# Patient Record
Sex: Female | Born: 1935 | Race: White | Hispanic: No
Health system: Southern US, Community
[De-identification: ages and names within clinical notes are randomized; demographics above are authoritative.]

## PROBLEM LIST (undated history)

## (undated) DIAGNOSIS — I350 Nonrheumatic aortic (valve) stenosis: Secondary | ICD-10-CM

## (undated) DIAGNOSIS — M1990 Unspecified osteoarthritis, unspecified site: Secondary | ICD-10-CM

## (undated) DIAGNOSIS — E039 Hypothyroidism, unspecified: Secondary | ICD-10-CM

## (undated) DIAGNOSIS — J449 Chronic obstructive pulmonary disease, unspecified: Secondary | ICD-10-CM

## (undated) DIAGNOSIS — Z87442 Personal history of urinary calculi: Secondary | ICD-10-CM

## (undated) DIAGNOSIS — I2510 Atherosclerotic heart disease of native coronary artery without angina pectoris: Secondary | ICD-10-CM

## (undated) DIAGNOSIS — I340 Nonrheumatic mitral (valve) insufficiency: Secondary | ICD-10-CM

## (undated) DIAGNOSIS — I050 Rheumatic mitral stenosis: Secondary | ICD-10-CM

## (undated) DIAGNOSIS — I10 Essential (primary) hypertension: Secondary | ICD-10-CM

## (undated) DIAGNOSIS — E782 Mixed hyperlipidemia: Secondary | ICD-10-CM

## (undated) DIAGNOSIS — K219 Gastro-esophageal reflux disease without esophagitis: Secondary | ICD-10-CM

## (undated) HISTORY — DX: Nonrheumatic aortic (valve) stenosis: I35.0

## (undated) HISTORY — DX: Gastro-esophageal reflux disease without esophagitis: K21.9

## (undated) HISTORY — PX: KYPHOPLASTY: SHX5884

## (undated) HISTORY — DX: Atherosclerotic heart disease of native coronary artery without angina pectoris: I25.10

## (undated) HISTORY — DX: Mixed hyperlipidemia: E78.2

## (undated) HISTORY — DX: Essential (primary) hypertension: I10

## (undated) HISTORY — DX: Hypothyroidism, unspecified: E03.9

## (undated) HISTORY — DX: Rheumatic mitral stenosis: I05.0

## (undated) HISTORY — PX: ABDOMINAL HYSTERECTOMY: SHX81

## (undated) HISTORY — DX: Nonrheumatic mitral (valve) insufficiency: I34.0

---

## 1999-02-11 ENCOUNTER — Encounter: Payer: Self-pay | Admitting: Internal Medicine

## 1999-02-11 ENCOUNTER — Inpatient Hospital Stay (HOSPITAL_COMMUNITY): Admission: AD | Admit: 1999-02-11 | Discharge: 1999-02-14 | Payer: Self-pay | Admitting: Internal Medicine

## 2000-01-15 ENCOUNTER — Inpatient Hospital Stay (HOSPITAL_COMMUNITY): Admission: AD | Admit: 2000-01-15 | Discharge: 2000-01-19 | Payer: Self-pay | Admitting: *Deleted

## 2000-01-15 ENCOUNTER — Encounter (INDEPENDENT_AMBULATORY_CARE_PROVIDER_SITE_OTHER): Payer: Self-pay | Admitting: Specialist

## 2000-01-16 ENCOUNTER — Encounter: Payer: Self-pay | Admitting: *Deleted

## 2000-01-19 ENCOUNTER — Encounter: Payer: Self-pay | Admitting: Cardiology

## 2000-02-26 ENCOUNTER — Encounter: Payer: Self-pay | Admitting: Gastroenterology

## 2000-02-26 ENCOUNTER — Ambulatory Visit (HOSPITAL_COMMUNITY): Admission: RE | Admit: 2000-02-26 | Discharge: 2000-02-26 | Payer: Self-pay | Admitting: Gastroenterology

## 2000-05-05 ENCOUNTER — Ambulatory Visit (HOSPITAL_COMMUNITY): Admission: RE | Admit: 2000-05-05 | Discharge: 2000-05-05 | Payer: Self-pay | Admitting: Gastroenterology

## 2000-05-05 ENCOUNTER — Encounter: Payer: Self-pay | Admitting: Gastroenterology

## 2000-05-05 ENCOUNTER — Encounter (INDEPENDENT_AMBULATORY_CARE_PROVIDER_SITE_OTHER): Payer: Self-pay | Admitting: *Deleted

## 2000-08-16 ENCOUNTER — Encounter: Payer: Self-pay | Admitting: Gastroenterology

## 2000-08-16 ENCOUNTER — Ambulatory Visit (HOSPITAL_COMMUNITY): Admission: RE | Admit: 2000-08-16 | Discharge: 2000-08-16 | Payer: Self-pay | Admitting: Gastroenterology

## 2000-11-25 HISTORY — PX: CORONARY ARTERY BYPASS GRAFT: SHX141

## 2001-03-25 ENCOUNTER — Encounter: Payer: Self-pay | Admitting: Gastroenterology

## 2001-03-25 ENCOUNTER — Ambulatory Visit (HOSPITAL_COMMUNITY): Admission: RE | Admit: 2001-03-25 | Discharge: 2001-03-25 | Payer: Self-pay | Admitting: Gastroenterology

## 2001-09-29 ENCOUNTER — Inpatient Hospital Stay (HOSPITAL_COMMUNITY): Admission: AD | Admit: 2001-09-29 | Discharge: 2001-10-14 | Payer: Self-pay | Admitting: *Deleted

## 2001-10-01 ENCOUNTER — Encounter: Payer: Self-pay | Admitting: *Deleted

## 2001-10-02 ENCOUNTER — Encounter: Payer: Self-pay | Admitting: *Deleted

## 2001-10-05 ENCOUNTER — Encounter: Payer: Self-pay | Admitting: *Deleted

## 2001-10-06 ENCOUNTER — Encounter: Payer: Self-pay | Admitting: Cardiothoracic Surgery

## 2001-10-07 ENCOUNTER — Encounter: Payer: Self-pay | Admitting: Cardiothoracic Surgery

## 2001-10-08 ENCOUNTER — Encounter: Payer: Self-pay | Admitting: Cardiothoracic Surgery

## 2001-10-09 ENCOUNTER — Encounter: Payer: Self-pay | Admitting: Cardiothoracic Surgery

## 2002-07-25 ENCOUNTER — Inpatient Hospital Stay (HOSPITAL_COMMUNITY): Admission: EM | Admit: 2002-07-25 | Discharge: 2002-07-29 | Payer: Self-pay | Admitting: Cardiology

## 2003-07-14 ENCOUNTER — Inpatient Hospital Stay (HOSPITAL_COMMUNITY): Admission: EM | Admit: 2003-07-14 | Discharge: 2003-07-16 | Payer: Self-pay | Admitting: *Deleted

## 2004-10-01 ENCOUNTER — Ambulatory Visit: Payer: Self-pay | Admitting: Cardiology

## 2004-11-05 ENCOUNTER — Ambulatory Visit: Payer: Self-pay | Admitting: Gastroenterology

## 2004-11-30 ENCOUNTER — Ambulatory Visit (HOSPITAL_COMMUNITY): Admission: RE | Admit: 2004-11-30 | Discharge: 2004-11-30 | Payer: Self-pay | Admitting: Gastroenterology

## 2004-11-30 ENCOUNTER — Ambulatory Visit: Payer: Self-pay | Admitting: Gastroenterology

## 2005-01-01 ENCOUNTER — Ambulatory Visit: Payer: Self-pay | Admitting: Gastroenterology

## 2005-06-17 ENCOUNTER — Encounter: Admission: RE | Admit: 2005-06-17 | Discharge: 2005-06-17 | Payer: Self-pay | Admitting: General Surgery

## 2005-07-15 ENCOUNTER — Ambulatory Visit: Payer: Self-pay | Admitting: Gastroenterology

## 2005-07-24 ENCOUNTER — Ambulatory Visit: Payer: Self-pay | Admitting: Cardiology

## 2005-07-25 ENCOUNTER — Ambulatory Visit: Payer: Self-pay | Admitting: Cardiology

## 2005-12-16 ENCOUNTER — Ambulatory Visit: Payer: Self-pay | Admitting: Cardiology

## 2006-02-12 ENCOUNTER — Ambulatory Visit (HOSPITAL_COMMUNITY): Admission: RE | Admit: 2006-02-12 | Discharge: 2006-02-12 | Payer: Self-pay | Admitting: General Surgery

## 2006-09-24 ENCOUNTER — Ambulatory Visit: Payer: Self-pay | Admitting: Cardiology

## 2006-10-15 ENCOUNTER — Ambulatory Visit: Payer: Self-pay | Admitting: Cardiology

## 2007-02-23 ENCOUNTER — Ambulatory Visit: Payer: Self-pay | Admitting: Gastroenterology

## 2007-02-24 ENCOUNTER — Ambulatory Visit (HOSPITAL_COMMUNITY): Admission: RE | Admit: 2007-02-24 | Discharge: 2007-02-24 | Payer: Self-pay | Admitting: Gastroenterology

## 2007-03-02 ENCOUNTER — Ambulatory Visit: Payer: Self-pay | Admitting: Gastroenterology

## 2007-06-29 ENCOUNTER — Encounter: Payer: Self-pay | Admitting: Cardiology

## 2007-07-16 ENCOUNTER — Encounter: Payer: Self-pay | Admitting: Cardiology

## 2007-07-21 ENCOUNTER — Encounter: Payer: Self-pay | Admitting: Cardiology

## 2008-01-21 ENCOUNTER — Ambulatory Visit: Payer: Self-pay | Admitting: Cardiology

## 2008-01-21 ENCOUNTER — Encounter: Payer: Self-pay | Admitting: Physician Assistant

## 2008-08-31 ENCOUNTER — Ambulatory Visit: Payer: Self-pay | Admitting: Cardiology

## 2008-08-31 ENCOUNTER — Encounter: Payer: Self-pay | Admitting: Physician Assistant

## 2008-09-01 ENCOUNTER — Encounter: Payer: Self-pay | Admitting: Cardiology

## 2009-08-14 DIAGNOSIS — E785 Hyperlipidemia, unspecified: Secondary | ICD-10-CM

## 2009-08-14 DIAGNOSIS — I2510 Atherosclerotic heart disease of native coronary artery without angina pectoris: Secondary | ICD-10-CM

## 2009-08-14 DIAGNOSIS — I10 Essential (primary) hypertension: Secondary | ICD-10-CM

## 2010-02-05 ENCOUNTER — Ambulatory Visit: Payer: Self-pay | Admitting: Cardiology

## 2010-02-05 DIAGNOSIS — G609 Hereditary and idiopathic neuropathy, unspecified: Secondary | ICD-10-CM | POA: Insufficient documentation

## 2010-02-05 DIAGNOSIS — I080 Rheumatic disorders of both mitral and aortic valves: Secondary | ICD-10-CM

## 2010-02-12 ENCOUNTER — Ambulatory Visit: Payer: Self-pay | Admitting: Cardiology

## 2010-02-12 ENCOUNTER — Encounter: Payer: Self-pay | Admitting: Cardiology

## 2010-02-14 ENCOUNTER — Encounter: Payer: Self-pay | Admitting: Cardiology

## 2010-09-11 ENCOUNTER — Ambulatory Visit: Payer: Self-pay | Admitting: Cardiology

## 2010-09-11 DIAGNOSIS — T148XXA Other injury of unspecified body region, initial encounter: Secondary | ICD-10-CM | POA: Insufficient documentation

## 2010-09-12 ENCOUNTER — Ambulatory Visit: Payer: Self-pay | Admitting: Cardiology

## 2010-10-15 ENCOUNTER — Ambulatory Visit: Payer: Self-pay | Admitting: Physician Assistant

## 2010-10-15 DIAGNOSIS — I6529 Occlusion and stenosis of unspecified carotid artery: Secondary | ICD-10-CM

## 2010-10-22 ENCOUNTER — Encounter: Payer: Self-pay | Admitting: Physician Assistant

## 2010-11-13 ENCOUNTER — Telehealth: Payer: Self-pay | Admitting: Cardiology

## 2010-12-20 ENCOUNTER — Ambulatory Visit (HOSPITAL_COMMUNITY)
Admission: RE | Admit: 2010-12-20 | Discharge: 2010-12-20 | Payer: Self-pay | Source: Home / Self Care | Attending: Interventional Radiology | Admitting: Interventional Radiology

## 2010-12-25 ENCOUNTER — Ambulatory Visit (HOSPITAL_COMMUNITY)
Admission: RE | Admit: 2010-12-25 | Discharge: 2010-12-25 | Payer: Self-pay | Source: Home / Self Care | Attending: Interventional Radiology | Admitting: Interventional Radiology

## 2010-12-25 LAB — CBC
HCT: 34.9 % — ABNORMAL LOW (ref 36.0–46.0)
MCHC: 33.2 g/dL (ref 30.0–36.0)
RDW: 13.1 % (ref 11.5–15.5)
WBC: 7.1 10*3/uL (ref 4.0–10.5)

## 2010-12-25 LAB — APTT: aPTT: 28 seconds (ref 24–37)

## 2010-12-25 LAB — POCT I-STAT, CHEM 8
Calcium, Ion: 1.08 mmol/L — ABNORMAL LOW (ref 1.12–1.32)
Glucose, Bld: 101 mg/dL — ABNORMAL HIGH (ref 70–99)
HCT: 34 % — ABNORMAL LOW (ref 36.0–46.0)
Hemoglobin: 11.6 g/dL — ABNORMAL LOW (ref 12.0–15.0)
TCO2: 25 mmol/L (ref 0–100)

## 2010-12-25 LAB — PROTIME-INR
INR: 1.04 (ref 0.00–1.49)
Prothrombin Time: 13.8 seconds (ref 11.6–15.2)

## 2010-12-25 NOTE — Assessment & Plan Note (Signed)
Summary: 1 YR FU -SRS   Visit Type:  Follow-up Primary Provider:  Dimas Aguas  CC:  follow-up visit.  History of Present Illness: the patient is an 77 year old female with history of coronary disease status post coronary bypass grafting in 2002. She had catheterization in 2004 that revealed stable anatomy, with possible ischemia in the first diagonal branch distribution. Cardiolite study in November of 2007 however revealed no ischemia with an ejection fraction of 57%. She has a history of moderate mitral regurgitation.  The patient has significant COPD and is followed by Dr. Orson Aloe. She has chronic dyspnea. However she stated her shortness of breath has worsened over the last several months, now occurring on minimal exertion. She also reports pain and numbness in both lower extremities below the level of both knees. There is no motor dysfunction associated with this.  The patient denies any chest pain, palpitations orthopnea PND.  Clinical Review Panels:  CXR CXR results  The heart size is normal status post median sternotomy         and CABG.  The aorta is ectatic.  There is an increased AP diameter         of the chest associated with a thoracic kyphosis and multiple         thoracic compression deformities.  There appears to be at least one         old rib fracture on the right.  The lungs are clear.  No definite         nodules are visible.  There is no pleural effusion.                   IMPRESSION:                   1.  No demonstrated acute chest findings status post CABG.         2.  Hyperinflated lungs with increased AP diameter of the chest         compatible with chronic obstructive pulmonary disease.         3.  The right lower lobe nodule demonstrated by prior CT is not         visualized.  Please refer to prior CT report.         4.  Multiple thoracic compression deformities. (08/31/2008)  Echocardiogram Echocardiogram Conclusions:         1. The estimated ejection  fraction is 60-65%.          2. There is normal left ventricular systolic function.         3. The left atrium is moderately dilated.         4. The right atrium is moderately dilated.          5. Mild aortic leaflet calcification is visualized.         6. Systolic excursion of the aortic valve is normal.         7. There is mild mitral regurgitation.          8. There is mild tricuspid regurgitation.         9. The pericardium appears normal.         10. The inferior vena cava appears normal. (08/31/2008)  Carotid Studies Carotid Doppler Results RIGHT CAROTID ARTERY:  Moderate right carotid bifurcation      atherosclerosis with calcified plaque formation.  By velocity and         ratio criteria as well  as gray-scale imaging, there is no         hemodynamically significant right ICA stenosis.  Degree of         narrowing is within the 0 - 50% range. Wave form analysis         demonstrates no significant spectral broadening or turbulent flow.         Right ECA velocity is markedly elevated as documented above         consistent with an ECA origin stenosis.                   RIGHT VERTEBRAL ARTERY:  Patent and antegrade                   LEFT CAROTID ARTERY: Left carotid bifurcation calcified plaque         formation is similar appearance to the right.  By velocity and         ratio criteria as well as gray-scale imaging, there is no         hemodynamically significant left ICA stenosis detected.  Wave form         analysis demonstrates no significant spectral broadening or         turbulent flow.  Marked elevation in the left ECA velocity is         consistent with a severe ECA stenosis.                   LEFT VERTEBRAL ARTERY:  Patent and antegrade                   IMPRESSION:         Bilateral moderate carotid atherosclerosis and calcified plaque         formation.         No hemodynamically significant ICA stenosis on either side.          Bilateral ECA origins stenoses.  (09/01/2008)    Preventive Screening-Counseling & Management  Alcohol-Tobacco     Smoking Status: never  Current Medications (verified): 1)  Pindolol 5 Mg Tabs (Pindolol) .... Take 1/2 Tablet By Mouth Two Times A Day 2)  Nexium 40 Mg Cpdr (Esomeprazole Magnesium) .... Take 1 Tablet By Mouth Once A Day 3)  Caltrate 600 1500 Mg Tabs (Calcium Carbonate) .... Take 1 Tablet By Mouth Once A Day 4)  Fish Oil 1000 Mg Caps (Omega-3 Fatty Acids) .... Take 1 Tablet By Mouth Two Times A Day 5)  Potassium 99 Mg Tabs (Potassium) .... Take 1 Tablet By Mouth Two Times A Day 6)  Vitamin D 1000 Unit Tabs (Cholecalciferol) .... Take 1 Tablet By Mouth Once A Day 7)  Aspir-Low 81 Mg Tbec (Aspirin) .... Take 1 Tablet By Mouth Once A Day 8)  Vitamin B-12 250 Mcg Tabs (Cyanocobalamin) .... Take 1 Tablet By Mouth Once A Day 9)  Evista 60 Mg Tabs (Raloxifene Hcl) .... Take 1 Tablet By Mouth Once A Day 10)  Ipratropium Bromide 0.03 % Soln (Ipratropium Bromide) .... Three Times A Day 11)  Albuterol Sulfate (2.5 Mg/25ml) 0.083% Nebu (Albuterol Sulfate) .... Take 1 Tablet By Mouth Three Times A Day 12)  Synthroid 112 Mcg Tabs (Levothyroxine Sodium) .... Take 1 Tablet By Mouth Once A Day 13)  Tylenol 325 Mg Tabs (Acetaminophen) .... As Needed 14)  Atrovent Hfa 17 Mcg/act Aers (Ipratropium Bromide Hfa) .... As Needed 15)  Glucosamine-Chondroitin 500-400 Mg Caps (Glucosamine-Chondroitin) .... Take 1  Tablet By Mouth Two Times A Day 16)  Multivitamins  Tabs (Multiple Vitamin) .... Take 1 Tablet By Mouth Once A Day 17)  Vitamin C 500 Mg Tabs (Ascorbic Acid) .... Take 1 Tablet By Mouth Once A Day 18)  Hydrocodone-Acetaminophen 5-500 Mg Tabs (Hydrocodone-Acetaminophen) .... As Needed 19)  Vesicare 5 Mg Tabs (Solifenacin Succinate) .... Take 1/2 Tablet By Mouth Two Times A Day  Allergies (verified): 1)  ! Nitroglycerin 2)  ! Pcn  Comments:  Nurse/Medical Assistant: The patient's medications and allergies were reviewed  with the patient and were updated in the Medication and Allergy Lists. List reviewed.   Past History:  Past Medical History: Last updated: 08/14/2009 HYPERLIPIDEMIA-MIXED (ICD-272.4) HYPERTENSION, UNSPECIFIED (ICD-401.9) CAD, ARTERY BYPASS GRAFT (ICD-414.04) Preserved LV function.  Moderate mitral regurgitation/mild aortic insufficiency.  COPD followed by Dr. Orson Aloe.  Treated hypothyroidism.  History of vasovagal syncope  Past Surgical History: Last updated: 08/14/2009 Abdominal Hysterectomy-Total Appendectomy Cataract Extraction CABG  Family History: Last updated: 02/05/2010 noncontributory  Social History: Last updated: 08/14/2009 Widowed  Tobacco Use - No.  Alcohol Use - no  Risk Factors: Smoking Status: never (02/05/2010)  Family History: Reviewed history and no changes required. noncontributory  Social History: Reviewed history from 08/14/2009 and no changes required. Widowed  Tobacco Use - No.  Alcohol Use - no  Review of Systems       The patient complains of shortness of breath.  The patient denies fatigue, malaise, fever, weight gain/loss, vision loss, decreased hearing, hoarseness, chest pain, palpitations, prolonged cough, wheezing, sleep apnea, coughing up blood, abdominal pain, blood in stool, nausea, vomiting, diarrhea, heartburn, incontinence, blood in urine, muscle weakness, joint pain, leg swelling, rash, skin lesions, headache, fainting, dizziness, depression, anxiety, enlarged lymph nodes, easy bruising or bleeding, and environmental allergies.    Vital Signs:  Patient profile:   77 year old female Height:      65 inches Weight:      140 pounds Pulse rate:   85 / minute BP sitting:   116 / 72  (left arm) Cuff size:   regular  Vitals Entered By: Carlye Grippe (February 05, 2010 1:54 PM) CC: follow-up visit   Physical Exam  Additional Exam:  General: Well-developed, well-nourished in no distress head: Normocephalic and  atraumatic eyes PERRLA/EOMI intact, conjunctiva and lids normal nose: No deformity or lesions mouth normal dentition, normal posterior pharynx neck: Supple, no JVD.  No masses, thyromegaly or abnormal cervical nodes lungs: decreased sounds bilaterally without wheezing.  Normal percussion heart: regular rate and rhythm with normal S1 and S2, no S3 or S4.  PMI is normal.  No pathological murmurs abdomen: Normal bowel sounds, abdomen is soft and nontender without masses, organomegaly or hernias noted.  No hepatosplenomegaly musculoskeletal: Back normal, normal gait muscle strength and tone normal pulsus: Pulse is normal in all 4 extremities Extremities: No peripheral pitting edema neurologic: Alert and oriented x 3 skin: Intact without lesions or rashes cervical nodes: No significant adenopathy psychologic: Normal affect    EKG  Procedure date:  02/05/2010  Findings:      normal sinus rhythm. Normal EKG heart rate 85 beats per minute  Impression & Recommendations:  Problem # 1:  CAD, ARTERY BYPASS GRAFT (ICD-414.04) patient has no recurrent chest pain. EKG is stable. We'll continue secondary prevention. Her updated medication list for this problem includes:    Pindolol 5 Mg Tabs (Pindolol) .Marland Kitchen... Take 1/2 tablet by mouth two times a day    Aspir-low 81 Mg Tbec (  Aspirin) .Marland Kitchen... Take 1 tablet by mouth once a day  Orders: EKG w/ Interpretation (93000)  Problem # 2:  HYPERTENSION, UNSPECIFIED (ICD-401.9) Assessment: Comment Only  Her updated medication list for this problem includes:    Pindolol 5 Mg Tabs (Pindolol) .Marland Kitchen... Take 1/2 tablet by mouth two times a day    Aspir-low 81 Mg Tbec (Aspirin) .Marland Kitchen... Take 1 tablet by mouth once a day  Problem # 3:  MITRAL REGURGITATION (ICD-396.3) the patient has increased dyspnea. She has known mitral regurgitation. I've ordered an echocardiogram to reassess her mitral regurgitation. Orders: 2-D Echocardiogram (2D Echo)  Problem # 4:   PERIPHERAL NEUROPATHY (ICD-356.9) it appears the patient has a peripheral neuropathy of unclear etiology. She does not appear to have vascular insufficiency. She had ABIs done last year which were within normal limits. I have ordered EMGs and nerve conduction velocity.  Patient Instructions: 1)  2D Echo  2)  EMG/NCV - Referral to Dr. Ninetta Lights 3)  Follow up in  6 months  Appended Document: Orders Update - Neurology referral Referral faxed to [facility].    Clinical Lists Changes  Orders: Added new Referral order of Neurology Referral (Neuro) - Signed

## 2010-12-25 NOTE — Miscellaneous (Signed)
Summary: Orders Update - EKG   Clinical Lists Changes  Orders: Added new Service order of EKG w/ Interpretation (93000) - Signed

## 2010-12-25 NOTE — Miscellaneous (Signed)
Summary: Orders Update  Clinical Lists Changes  Problems: Added new problem of LONG-TERM (CURRENT) USE OF OTHER MEDICATIONS (ICD-V58.69) Orders: Added new Test order of T-Lipid Profile [id number]) - Signed Added new Test order of T-Hepatic Function [phone]) - Signed

## 2010-12-25 NOTE — Assessment & Plan Note (Signed)
Summary: 6 MOF U PER SEPT REMINDER   Visit Type:  Follow-up Primary Provider:  Dimas Aguas   History of Present Illness: the patient is an 77 year old female with history of coronary artery disease, status post coronary bypass grafting in 2002. She had a catheterization in 2004 that revealed stable anatomy with possible ischemia in the first diagonal branch distribution. She had another Cardiolite study done in November of 2007 revealed no ischemia with an ejection fraction of 57%. Chest history of moderate mitral regurgitation. The patient had a recent followup echocardiogram done which showed again an ejection fraction of 65% and moderate mitral regurgitation.  The patient has significant COPD and is followed by Dr. Orson Aloe. Chest chronic dyspnea.  The patient now complains of worsening shortness of breath. Her main complaint however is severe epigastric pain which started 10 days ago. She has been unable to see her primary care physician. She states that when she eats something the pain epigastric area becomes worse. Interestingly she has not really had substernal chest pain. Walking does increase the pain and also standing. She states when she lies down her symptoms improved somewhat. She also feels that food gets hung up in the upper esophagus. She is unable to tolerate her bra around her lower sternum. Epigastric pain and also be referred to the back interestingly in the area of the upper thoracic vertebra. It is noted that the patient has a history of multiple thoracic compression deformities.  An EKG will be obtained to make sure the patient does not have an acute myocardial infarction.  Preventive Screening-Counseling & Management  Alcohol-Tobacco     Smoking Status: never  Current Medications (verified): 1)  Pindolol 5 Mg Tabs (Pindolol) .... Take 1/2 Tablet By Mouth Two Times A Day 2)  Nexium 40 Mg Cpdr (Esomeprazole Magnesium) .... Take 1 Tablet By Mouth Once A Day 3)  Caltrate 600+d  600-400 Mg-Unit Tabs (Calcium Carbonate-Vitamin D) .... Take 1 Tablet By Mouth Two Times A Day 4)  Potassium 99 Mg Tabs (Potassium) .... Take 1 Tablet By Mouth Two Times A Day 5)  Vitamin B-12 1000 Mcg Tabs (Cyanocobalamin) .... Take 1 Tablet By Mouth Once A Day 6)  Evista 60 Mg Tabs (Raloxifene Hcl) .... Take 1 Tablet By Mouth Once A Day 7)  Ipratropium Bromide 0.03 % Soln (Ipratropium Bromide) .... Three Times A Day 8)  Albuterol Sulfate (2.5 Mg/8ml) 0.083% Nebu (Albuterol Sulfate) .... Take 1 Tablet By Mouth Three Times A Day 9)  Synthroid 112 Mcg Tabs (Levothyroxine Sodium) .... Take 1 Tablet By Mouth Once A Day 10)  Tylenol 325 Mg Tabs (Acetaminophen) .... As Needed 11)  Atrovent Hfa 17 Mcg/act Aers (Ipratropium Bromide Hfa) .... As Needed 12)  Glucosamine-Chondroitin 500-400 Mg Caps (Glucosamine-Chondroitin) .... Take 1 Tablet By Mouth Two Times A Day 13)  Multivitamins  Tabs (Multiple Vitamin) .... Take 1 Tablet By Mouth Once A Day 14)  Hydrocodone-Acetaminophen 5-500 Mg Tabs (Hydrocodone-Acetaminophen) .... As Needed 15)  Citalopram Hydrobromide 10 Mg Tabs (Citalopram Hydrobromide) .... Take 1 Tablet By Mouth Once A Day 16)  Nitrofurantoin Monohyd Macro 100 Mg Caps (Nitrofurantoin Monohyd Macro) .... Take 1 Tablet By Mouth Two Times A Day 17)  Qualaquin 324 Mg Caps (Quinine Sulfate) .... As Needed 18)  Biotin 5000 5 Mg Caps (Biotin) .... Take 1 Tablet By Mouth Once A Day 19)  Magox 400 400 (241.3 Mg) Mg Tabs (Magnesium Oxide) .... Take 1 Tablet By Mouth Once A Day  Allergies (verified): 1)  !  Nitroglycerin 2)  ! Pcn  Comments:  Nurse/Medical Assistant: The patient's medication bottles and allergies were reviewed with the patient and were updated in the Medication and Allergy Lists.  Past History:  Past Medical History: HYPERLIPIDEMIA-MIXED (ICD-272.4) HYPERTENSION, UNSPECIFIED (ICD-401.9) CAD, ARTERY BYPASS GRAFT (ICD-414.04) 2002, catheterization 2004 Cardiolite study 2007  with no ischemia and ejection fraction of 57% Echocardiogram March 2011 ejection fraction 65% with moderate mitral regurgitation. No aortic stenosis Preserved LV function.  Moderate mitral regurgitation/mild aortic insufficiency.  COPD followed by Dr. Orson Aloe.  Treated hypothyroidism.  History of vasovagal syncope  Clinical Review Panels:  CXR CXR results  The heart size is normal status post median sternotomy         and CABG.  The aorta is ectatic.  There is an increased AP diameter         of the chest associated with a thoracic kyphosis and multiple         thoracic compression deformities.  There appears to be at least one         old rib fracture on the right.  The lungs are clear.  No definite         nodules are visible.  There is no pleural effusion.                   IMPRESSION:                   1.  No demonstrated acute chest findings status post CABG.         2.  Hyperinflated lungs with increased AP diameter of the chest         compatible with chronic obstructive pulmonary disease.         3.  The right lower lobe nodule demonstrated by prior CT is not         visualized.  Please refer to prior CT report.         4.  Multiple thoracic compression deformities. (08/31/2008)  Echocardiogram Echocardiogram Conclusions: 1. The left ventricular chamber size is normal. Mild concentric left ventricular hypertrophy is observed. Global left ventricular wall motion and contractility are within normal limits. The estimated ejection fraction is 60-65%. Abnormal left ventricular diastolic filling is observed, consistent with impaired relaxation.   2. The left atrium is mild to moderately dilated.   3. There is relative systolic bowing of the anterior mitral valve leaflet without evidence of prolapse. There is at least moderate mitral regurgitation. The mitral regurgitant jet is eccentric. PISA not performed.  4. Mild aortic leaflet calcification is visualized. Systolic excursion of  the aortic valve cusps is mildly reduced. There is aortic annular calcification. There is mild aortic regurgitation. The mean gradient of the aortic valve is 7 mmHg.   5. The right ventricular global systolic function is normal.  6. There is mild tricuspid regurgitation.  7. The right ventricular systolic pressure is calculated at 33 mmHg.   8. There is no pericardial effusion.    (02/12/2010)  Carotid Studies Carotid Doppler Results RIGHT CAROTID ARTERY:  Moderate right carotid bifurcation      atherosclerosis with calcified plaque formation.  By velocity and         ratio criteria as well as gray-scale imaging, there is no         hemodynamically significant right ICA stenosis.  Degree of         narrowing is within the 0 - 50% range. Wave form analysis  demonstrates no significant spectral broadening or turbulent flow.         Right ECA velocity is markedly elevated as documented above         consistent with an ECA origin stenosis.                   RIGHT VERTEBRAL ARTERY:  Patent and antegrade                   LEFT CAROTID ARTERY: Left carotid bifurcation calcified plaque         formation is similar appearance to the right.  By velocity and         ratio criteria as well as gray-scale imaging, there is no         hemodynamically significant left ICA stenosis detected.  Wave form         analysis demonstrates no significant spectral broadening or         turbulent flow.  Marked elevation in the left ECA velocity is         consistent with a severe ECA stenosis.                   LEFT VERTEBRAL ARTERY:  Patent and antegrade                   IMPRESSION:         Bilateral moderate carotid atherosclerosis and calcified plaque         formation.         No hemodynamically significant ICA stenosis on either side.          Bilateral ECA origins stenoses. (09/01/2008)  Cardiac Imaging Cardiac Cath Findings  1. The left main coronary artery has an ostial 20-30% stenosis.    1. Left anterior descending coronary artery was a large caliber vessel with     a proximal stenosis of approximately 50% followed by a tandem lesion of     approximately 70% just beyond the first diagonal.  The first diagonal had     a 90% stenosis but was a small vessel.  The distal LAD had a 50% stenosis     just prior to the insertion site of the internal mammary artery.   1. The circumflex artery is a large caliber vessel with a 50% stenosis in     the obtuse marginal branch and posterior descending artery had a 50-70%     stenosis (left dominant system).   1. The right coronary artery was nondominant.  It had a stent that was     widely patent in the proximal vessel followed by 70% stenosis.  However,     the terminal portion of this vessel was rather small.   GRAFT ANATOMY:  The left internal mammary artery to the LAD was widely  patent.  Saphenous vein graft to the second diagonal branch had  approximately 50% stenosis in the distal part of the vessel.    IMPRESSION/RECOMMENDATIONS:  No definite indication for PCI.  The patient  can be managed medically with increased Lopressor and  continued IMDUR.  There is no evidence of gradient across the aortic valve.  The patient can  be discharged tomorrow.  Continue risk factor modifications as indicated.  Learta Codding, M.D. (03/27/2003)    Review of Systems       The patient complains of malaise, chest pain, shortness of breath, and heartburn.  The patient denies fatigue, fever, weight gain/loss, vision loss, decreased hearing, hoarseness, palpitations, prolonged cough, wheezing, sleep apnea, coughing up blood, abdominal pain, blood in stool, nausea, vomiting, diarrhea, incontinence, blood in urine, muscle weakness, joint pain, leg swelling, rash, skin lesions, headache, fainting, dizziness, depression, anxiety, enlarged lymph nodes, easy bruising or bleeding, and environmental allergies.     Vital Signs:  Patient profile:   77 year old female Height:      65 inches Weight:      135 pounds O2 Sat:      99 % on Room air Pulse rate:   69 / minute BP sitting:   113 / 67  (left arm) Cuff size:   regular  Vitals Entered By: Carlye Grippe (September 11, 2010 11:18 AM)  O2 Flow:  Room air  Physical Exam  Additional Exam:  General: Well-developed, well-nourished in no distress head: Normocephalic and atraumatic eyes PERRLA/EOMI intact, conjunctiva and lids normal nose: No deformity or lesions mouth normal dentition, normal posterior pharynx neck: Supple, no JVD.  No masses, thyromegaly or abnormal cervical nodes lungs: decreased sounds bilaterally without wheezing.  Normal percussion heart: regular rate and rhythm with normal S1 and S2, no S3 or S4.  PMI is normal.  No pathological murmurs abdomen: Normal bowel sounds, abdomen is soft and nontender without masses, organomegaly or hernias noted.  No hepatosplenomegaly musculoskeletal: Back normal, normal gait muscle strength and tone normal pulsus: Pulse is normal in all 4 extremities Extremities: No peripheral pitting edema neurologic: Alert and oriented x 3 skin: Intact without lesions or rashes cervical nodes: No significant adenopathy psychologic: Normal affect    EKG  Procedure date:  09/11/2010  Findings:      normal sinus rhythm with no acute infarct pattern  Impression & Recommendations:  Problem # 1:  CAD, ARTERY BYPASS GRAFT (ICD-414.04) the patient reports upper epigastric pain with some lower substernal chest pain. She should present with an atypical presentation for angina or unstable angina. The patient will be admitted and ruled out for myocardial infarction, will be given nitro paste as well as pain control. She will likely require a Cardiolite study if this is negative she will need a GI workup. We will proceed with a CT scan of the chest if her renal function is within normal limits to rule out  aortic dissection.EKG obtained in the office shows no acute infarct pattern. The aching today The following medications were removed from the medication list:    Aspir-low 81 Mg Tbec (Aspirin) .Marland Kitchen... Take 1 tablet by mouth once a day Her updated medication list for this problem includes:    Pindolol 5 Mg Tabs (Pindolol) .Marland Kitchen... Take 1/2 tablet by mouth two times a day  Problem # 2:  MITRAL REGURGITATION (ICD-396.3) moderate mitral regurgitation contribute to shortness of breath but stable  Problem # 3:  HYPERTENSION, UNSPECIFIED (ICD-401.9) blood pressure is controlled. The following medications were removed from the medication list:    Aspir-low 81 Mg Tbec (Aspirin) .Marland Kitchen... Take 1 tablet by mouth once a day Her updated medication list for this problem includes:    Pindolol 5 Mg Tabs (Pindolol) .Marland Kitchen... Take 1/2 tablet by mouth two times a day  Problem # 4:  PERIPHERAL NEUROPATHY (ICD-356.9) Assessment: Comment Only  Problem # 5:  COMPRESSION FRACTURE (ICD-829.0)  Patient Instructions:  1)  Admit to [facility]

## 2010-12-25 NOTE — Consult Note (Signed)
Summary: CARDIOLOGY CONSULT/ [facility]  CARDIOLOGY CONSULT/ [facility]   Imported By: Zachary George 10/15/2010 10:58:44  _____________________________________________________________________  External Attachment:    Type:   Image     Comment:   External Document

## 2010-12-25 NOTE — Assessment & Plan Note (Signed)
Summary: eph-post hosp per patient   Visit Type:  Follow-up Primary Provider:  Dimas Aguas   History of Present Illness: pt presents for posthospital followup, after being directly admitted to [facility], per Dr. Andee Lineman, for evaluation of chest pain.  Serial cardiac markers were normal, and 2-D echo indicated normal LVF  (EF 60-65%), no focal wall motion abnormalities, and moderate MR/TR. CT angiogram of chest was negative for PE, or thoracic aortic dissection.  Clinically, the patient is doing quite well, reporting no exertional chest pain. She suggests improvement of her symptoms, following up titration of Nexium, as well as adjustment of her inhalers, by Dr. Andee Lineman.  Of note, Dr. Andee Lineman placed her on amlodipine 5 mg daily. However, she did not start this, concerned that it would lower her blood pressure to much.  Preventive Screening-Counseling & Management  Alcohol-Tobacco     Smoking Status: never  Current Medications (verified): 1)  Pindolol 5 Mg Tabs (Pindolol) .... Take 1/2 Tablet By Mouth Two Times A Day 2)  Nexium 40 Mg Cpdr (Esomeprazole Magnesium) .... Take 1 Tablet By Mouth Two Times A Day 3)  Caltrate 600+d 600-400 Mg-Unit Tabs (Calcium Carbonate-Vitamin D) .... Take 1 Tablet By Mouth Two Times A Day 4)  Potassium 99 Mg Tabs (Potassium) .... Take 1 Tablet By Mouth Two Times A Day 5)  Vitamin B-12 1000 Mcg Tabs (Cyanocobalamin) .... Take 1 Tablet By Mouth Once A Day 6)  Evista 60 Mg Tabs (Raloxifene Hcl) .... Take 1 Tablet By Mouth Once A Day 7)  Ipratropium Bromide 0.03 % Soln (Ipratropium Bromide) .... Three Times A Day 8)  Albuterol Sulfate (2.5 Mg/69ml) 0.083% Nebu (Albuterol Sulfate) .... Take 1 Tablet By Mouth Three Times A Day 9)  Synthroid 112 Mcg Tabs (Levothyroxine Sodium) .... Take 1 Tablet By Mouth Once A Day 10)  Tylenol 325 Mg Tabs (Acetaminophen) .... As Needed 11)  Atrovent Hfa 17 Mcg/act Aers (Ipratropium Bromide Hfa) .... As Needed 12)  Multivitamins  Tabs (Multiple  Vitamin) .... Take 1 Tablet By Mouth Once A Day 13)  Hydrocodone-Acetaminophen 5-500 Mg Tabs (Hydrocodone-Acetaminophen) .... As Needed 14)  Citalopram Hydrobromide 10 Mg Tabs (Citalopram Hydrobromide) .... Take 1 Tablet By Mouth Once A Day 15)  Biotin 5000 5 Mg Caps (Biotin) .... Take 1 Tablet By Mouth Once A Day 16)  Magox 400 400 (241.3 Mg) Mg Tabs (Magnesium Oxide) .... Take 1 Tablet By Mouth Once A Day 17)  Advair Diskus 250-50 Mcg/dose Aepb (Fluticasone-Salmeterol) .... As Needed  Allergies (verified): 1)  ! Nitroglycerin 2)  ! Pcn  Past History:  Past Medical History: Last updated: 09/11/2010 HYPERLIPIDEMIA-MIXED (ICD-272.4) HYPERTENSION, UNSPECIFIED (ICD-401.9) CAD, ARTERY BYPASS GRAFT (ICD-414.04) 2002, catheterization 2004 Cardiolite study 2007 with no ischemia and ejection fraction of 57% Echocardiogram March 2011 ejection fraction 65% with moderate mitral regurgitation. No aortic stenosis Preserved LV function.  Moderate mitral regurgitation/mild aortic insufficiency.  COPD followed by Dr. Orson Aloe.  Treated hypothyroidism.  History of vasovagal syncope  Review of Systems       No fevers, chills, hemoptysis, dysphagia, melena, hematocheezia, hematuria, rash, claudication, orthopnea, pnd, pedal edema. All other systems negative.   Vital Signs:  Patient profile:   77 year old female Height:      68 inches Weight:      136.50 pounds BMI:     20.83 Pulse rate:   75 / minute BP sitting:   113 / 68  (left arm) Cuff size:   regular  Vitals Entered By: Hoover Brunette,  LPN (October 15, 2010 12:45 PM) Is Patient Diabetic? No Comments post hosp   Physical Exam  Additional Exam:  GEN: 77 year old female, sitting upright, no distress HEENT: NCAT,PERRLA,EOMI NECK: palpable pulses, right carotid bruit; no JVD; no TM LUNGS: CTA bilaterally HEART: RRR (S1S2); no significant murmurs; no rubs; no gallops ABD: soft, NT; intact BS EXT: intact distal pulses; no edema SKIN:  warm, dry MUSC: no obvious deformity NEURO: A/O (x3)     Impression & Recommendations:  Problem # 1:  CAD, ARTERY BYPASS GRAFT (ICD-414.04)  stable on current medication regimen. Recent hospitalization with negative cardiac markers, negative chest CT angiogram, and normal LVF by 2-D echocardiography. Resume Glucovance aspirin, and return for early clinic follow up with Dr. Andee Lineman.  Problem # 2:  HYPERLIPIDEMIA-MIXED (ICD-272.4)  patient reportedly unable to tolerate statins and fish oil. recommend reassessment of lipid status.  Problem # 3:  CAROTID BRUIT, RIGHT (ICD-785.9)  we'll assess further with carotid Dopplers. her last study, October 2009, suggested moderate bilateral ICA disease, and severe ECA stenoses.  Problem # 4:  HYPERTENSION, UNSPECIFIED (ICD-401.9)  well-controlled on current regimen. She was advised to not start amlodipine, as recently recommended per Dr. Andee Lineman, pending further discussion.  Other Orders: Carotid Duplex (Carotid Duplex)  Patient Instructions: 1)  Your physician wants you to follow-up in: 3 months. You will receive a reminder letter in the mail one-two months in advance. If you don't receive a letter, please call our office to schedule the follow-up appointment. 2)  Start Aspirin 81mg  by mouth once daily. 3)  Your physician has requested that you have a carotid duplex. This test is an ultrasound of the carotid arteries in your neck. It looks at blood flow through these arteries that supply the brain with blood. Allow one hour for this exam. There are no restrictions or special instructions. If the results of your test are normal or stable, you will receive a letter. If they are abnormal, the nurse will contact you by phone.

## 2010-12-25 NOTE — Letter (Signed)
Summary: External Correspondence/ FAXED REFERRAL DR. Ninetta Lights  External Correspondence/ FAXED REFERRAL DR. Ninetta Lights   Imported By: Dorise Hiss 02/26/2010 12:04:52  _____________________________________________________________________  External Attachment:    Type:   Image     Comment:   External Document

## 2010-12-27 NOTE — Progress Notes (Signed)
----   Converted from flag ---- ---- 11/02/2010 2:55 PM, Nelida Meuse, PA-C wrote: thanks, see you next week  ---- 11/02/2010 12:12 PM, Lewayne Bunting, MD, [facility] [place] wrote: agreeing  ---- 10/15/2010 2:12 PM, Nelida Meuse, PA-C wrote: Michelle Piper,  I just saw Kelly Splinter ([birth year]) for post hosp f/u, after you admitted her directly to [facility], from the office, for eval of CP. she's doing well, since you increased her Nexium and adjusted her MDI. However, you prescribed Norvasc 5 once daily at D/C, but she has  been hesitant to start this, concerned that it would drop her BP too much. Her pressure here, and recently at [facility], was fine. I told her I would discuss with you why you initially wanted to add this, but to not start it, given that she has remained normotensive. ------------------------------

## 2010-12-27 NOTE — Letter (Signed)
Summary: External Correspondence/ PROGRESS NOTE DR. Ninetta Lights  External Correspondence/ PROGRESS NOTE DR. Ninetta Lights   Imported By: Dorise Hiss 11/07/2010 13:57:20  _____________________________________________________________________  External Attachment:    Type:   Image     Comment:   External Document

## 2011-01-23 ENCOUNTER — Ambulatory Visit (INDEPENDENT_AMBULATORY_CARE_PROVIDER_SITE_OTHER): Payer: Medicare Other | Admitting: Cardiology

## 2011-01-23 ENCOUNTER — Encounter: Payer: Self-pay | Admitting: Cardiology

## 2011-01-23 DIAGNOSIS — J449 Chronic obstructive pulmonary disease, unspecified: Secondary | ICD-10-CM | POA: Insufficient documentation

## 2011-01-23 DIAGNOSIS — I2510 Atherosclerotic heart disease of native coronary artery without angina pectoris: Secondary | ICD-10-CM

## 2011-01-31 NOTE — Assessment & Plan Note (Signed)
Summary: 3 month fu-vs   Visit Type:  Follow-up Primary Provider:  Dimas Aguas   History of Present Illness: The patient had carotid Dopplers done.  She has an occluded left external carotid artery with only moderate bilateral internal carotid artery disease.  She is continued on medical therapy. Several months ago the patient was admitted for chest pain.  Cardiac markers were normal.  She had normal echocardiogram with no focal wall motion abnormalities.  She also had moderate mitral tricuspid regurgitation CT angiogram of the chest was negative for pulmonary embolism or thoracic aortic dissection. It appeared that the patient had symptoms of reflux and she improved significantly with Nexium.  She also had some adjustment made to inhaler therapy she had an LDL of 151 couple months ago. The patient has recent back surgery.  This was 4 weeks ago.  It appeared that a vertebroplasty. She stated she has some weight loss and was placed on Ensure by Dr. Dimas Aguas. From a cardiovascular standpoint is actually doing quite well.  She reports no chest pain or shortness of breath.  Just some nonproductive cough but her respiratory symptoms have improved significantly with her inhaler therapy.  Preventive Screening-Counseling & Management  Alcohol-Tobacco     Smoking Status: never  Current Medications (verified): 1)  Nexium 40 Mg Cpdr (Esomeprazole Magnesium) .... Take 1 Tablet By Mouth Two Times A Day 2)  Caltrate 600+d 600-400 Mg-Unit Tabs (Calcium Carbonate-Vitamin D) .... Take 1 Tablet By Mouth Two Times A Day 3)  Potassium 99 Mg Tabs (Potassium) .... Take 1 Tablet By Mouth Two Times A Day 4)  Vitamin B-12 1000 Mcg Tabs (Cyanocobalamin) .... Take 1 Tablet By Mouth Once A Day 5)  Evista 60 Mg Tabs (Raloxifene Hcl) .... Take 1 Tablet By Mouth Once A Day 6)  Ipratropium Bromide 0.03 % Soln (Ipratropium Bromide) .... Three Times A Day 7)  Albuterol Sulfate (2.5 Mg/83ml) 0.083% Nebu (Albuterol Sulfate) ....  Take 1 Tablet By Mouth Three Times A Day 8)  Synthroid 112 Mcg Tabs (Levothyroxine Sodium) .... Take 1 Tablet By Mouth Once A Day 9)  Tylenol 325 Mg Tabs (Acetaminophen) .... As Needed 10)  Atrovent Hfa 17 Mcg/act Aers (Ipratropium Bromide Hfa) .... As Needed 11)  Multivitamins  Tabs (Multiple Vitamin) .... Take 1 Tablet By Mouth Once A Day 12)  Hydrocodone-Acetaminophen 5-500 Mg Tabs (Hydrocodone-Acetaminophen) .... As Needed 13)  Citalopram Hydrobromide 10 Mg Tabs (Citalopram Hydrobromide) .... Take 1 Tablet By Mouth Once A Day 14)  Biotin 5000 5 Mg Caps (Biotin) .... Take 1 Tablet By Mouth Once A Day 15)  Advair Diskus 250-50 Mcg/dose Aepb (Fluticasone-Salmeterol) .... As Needed 16)  Vitamin D3 400 Unit Tabs (Cholecalciferol) .... Take 1 Tablet By Mouth Once A Day 17)  Proair Hfa 108 (90 Base) Mcg/act Aers (Albuterol Sulfate) .... As Needed  Allergies: 1)  ! Nitroglycerin 2)  ! Pcn  Comments:  Nurse/Medical Assistant: The patient's medications were reviewed with the patient and were updated in the Medication List. Pt brought medication bottles to office visit.  Cyril Loosen, RN, BSN (January 23, 2011 11:16 AM)  Past History:  Past Medical History: Last updated: 09/11/2010 HYPERLIPIDEMIA-MIXED (ICD-272.4) HYPERTENSION, UNSPECIFIED (ICD-401.9) CAD, ARTERY BYPASS GRAFT (ICD-414.04) 2002, catheterization 2004 Cardiolite study 2007 with no ischemia and ejection fraction of 57% Echocardiogram March 2011 ejection fraction 65% with moderate mitral regurgitation. No aortic stenosis Preserved LV function.  Moderate mitral regurgitation/mild aortic insufficiency.  COPD followed by Dr. Orson Aloe.  Treated hypothyroidism.  History of  vasovagal syncope  Past Surgical History: Last updated: 08/14/2009 Abdominal Hysterectomy-Total Appendectomy Cataract Extraction CABG  Family History: Last updated: 02/05/2010 noncontributory  Social History: Last updated: 08/14/2009 Widowed    Tobacco Use - No.  Alcohol Use - no  Risk Factors: Smoking Status: never (01/23/2011)  Review of Systems  The patient denies fatigue, malaise, fever, weight gain/loss, vision loss, decreased hearing, hoarseness, chest pain, palpitations, shortness of breath, prolonged cough, wheezing, sleep apnea, coughing up blood, abdominal pain, blood in stool, nausea, vomiting, diarrhea, heartburn, incontinence, blood in urine, muscle weakness, joint pain, leg swelling, rash, skin lesions, headache, fainting, dizziness, depression, anxiety, enlarged lymph nodes, easy bruising or bleeding, and environmental allergies.    Vital Signs:  Patient profile:   77 year old female Height:      68 inches Weight:      130.25 pounds Pulse rate:   83 / minute BP sitting:   123 / 77  (left arm) Cuff size:   regular  Vitals Entered By: Cyril Loosen, RN, BSN (January 23, 2011 11:08 AM) Comments no cardiac complaints-pt has cough   Physical Exam  Additional Exam:  GEN: 77 year old female, sitting upright, no distress HEENT: NCAT,PERRLA,EOMI NECK: palpable pulses, right carotid bruit; no JVD; no TM LUNGS: CTA bilaterally HEART: RRR (S1S2); no significant murmurs; no rubs; no gallops ABD: soft, NT; intact BS EXT: intact distal pulses; no edema SKIN: warm, dry MUSC: no obvious deformity NEURO: A/O (x3)     Impression & Recommendations:  Problem # 1:  CAD, ARTERY BYPASS GRAFT (ICD-414.04) coronary disease: Echocardiogram shows an ejection fraction of 61%.  No recurrent chest pain. coronary bypass grafting: Stable The following medications were removed from the medication list:    Pindolol 5 Mg Tabs (Pindolol) .Marland Kitchen... Take 1/2 tablet by mouth two times a day    Aspirin 81 Mg Tbec (Aspirin) .Marland Kitchen... Take one tablet by mouth daily  Problem # 2:  HYPERLIPIDEMIA-MIXED (ICD-272.4) Assessment: Comment Only  Problem # 3:  CAROTID BRUIT, RIGHT (ICD-785.9) carotid bruit: No significant carotid artery  disease.  Problem # 4:  COPD (ICD-496) COPD: Doing much better.  Her updated medication list for this problem includes:    Albuterol Sulfate (2.5 Mg/36ml) 0.083% Nebu (Albuterol sulfate) .Marland Kitchen... Take 1 tablet by mouth three times a day    Atrovent Hfa 17 Mcg/act Aers (Ipratropium bromide hfa) .Marland Kitchen... As needed    Advair Diskus 250-50 Mcg/dose Aepb (Fluticasone-salmeterol) .Marland Kitchen... As needed    Proair Hfa 108 (90 Base) Mcg/act Aers (Albuterol sulfate) .Marland Kitchen... As needed  Patient Instructions: 1)  Your physician wants you to follow-up in: 6 months. You will receive a reminder letter in the mail one-two months in advance. If you don't receive a letter, please call our office to schedule the follow-up appointment. 2)  Your physician recommends that you continue on your current medications as directed. Please refer to the Current Medication list given to you today.

## 2011-04-09 NOTE — H&P (Signed)
[facility] ADMISSION   NAME:Pope, BOBBI YOUNT                        MRN:          [id number]  DATE:08/31/2008                            DOB:          22-May-1920    CARDIOLOGIST:  Learta Codding, MD,FACC.   PRIMARY CARE PHYSICIAN:  Dr. Selinda Flavin.   REASON FOR VISIT:  Nine month followup.   HISTORY OF PRESENT ILLNESS:  Ms. Vermeer is an 77 year old female patient  followed by Dr. Andee Lineman in our [facility] clinic who presented to the office  today for routine followup.  She has a history of coronary artery  disease status post CABG in 2002.  Her last cardiac catheterization was  in 2004 that revealed stable anatomy and a possible ischemia in the  first diagonal branch distribution.  Her last Cardiolite study was in  November of 2007 that revealed no ischemia, EF of 57%.  She has a  history of moderate mitral regurgitation as evidenced by echocardiogram  in 2006.  The patient apparently has significant COPD and sees Dr.  Orson Aloe.  She is on daily nebulizer therapy.  She notes in the office  today that she has been suffering from exertional chest pain and  shortness of breath over the last 2-3 weeks.  She thinks that most of  her symptoms are probably related to her lung disease.  However, she has  used nebulizers over the last couple of weeks with relief at times and  also her symptoms have worsened at times with nebulizer therapy.  She  has had some radiation to her bilateral arms.  She denies any associated  nausea or diaphoresis.  She denies any syncope.  She sleeps on 2-3  pillows.  This is chronic without change.  She denies paroxysmal  nocturnal dyspnea.  She denies pedal edema.  She denies any increasing  cough.  She denies any purulent sputum.  She denies any fever or chills.  She does note some right lower extremity pain.  She feels that the  discomfort is in the site of her previous vein harvesting from her  bypass surgery.  Of note, she was having chest tightness in the office  today.   PAST MEDICAL HISTORY:  Coronary artery disease as outlined above.  In  addition, she has COPD followed by Dr. Orson Aloe.  Treated  hypothyroidism, hypertension, dyslipidemia - she is intolerant to  multiple statins.  A history of vasovagal syncope.  She has a history of  GERD, degenerative joint disease, nephrolithiasis.  She is status post  appendectomy, hysterectomy and bilateral cataract surgery.   MEDICATIONS:  1. Pindolol 5 mg 1/2 tablet b.i.d.  2. Nexium 40 mg daily.  3. Detrol LA 4 mg daily.  4. Calcium daily.  5. Fish oil b.i.d.  6. Potassium OCT99 mg b.i.d.  7. Vitamin D daily.  8. Aspirin 81 mg daily.  9. Vitamin B12 tablets daily.  10.Evista 60 mg daily.  11.Ipratropium nebulizers 3 times a day.  12.Albuterol nebulizers 3 times a day.  13.Synthroid 0.112 mg daily.  14.Tylenol p.r.n.  15.Atrovent p.r.n.  16.Darvocet p.r.n.   ALLERGIES:  PENICILLIN.  She is intolerant to MULTIPLE STATINS.   SOCIAL HISTORY:  She is a nonsmoker.  She is a widow.   FAMILY HISTORY:  Negative for CAD.   REVIEW OF SYSTEMS:  Please see HPI.  She denies any melena,  hematochezia, hematuria, dysuria.  The rest of the review of systems are  negative.   PHYSICAL EXAMINATION:  GENERAL:  She is a well-developed, well-nourished  female.  VITAL SIGNS:  Blood pressure is 129/76, pulse 87, weight 149.8 pounds.  Oxygen saturation 95% on room air.  HEENT:  Normal.  NECK:  Without JVD.  No lymphadenopathy.  ENDOCRINE:  Without thyromegaly.  CARDIAC:  Normal S1, S2.  Regular rate and rhythm with a 2/6  holosystolic murmur heard best at the apex.  LUNGS:  With decreased breath sounds bilaterally.  No wheezing, rhonchi  or rales.  ABDOMEN:  Soft, nontender.  Normoactive bowel sounds.  No organomegaly.  EXTREMITIES:  Without edema.  She does have a questionable palpable cord  overlying her right upper thigh in the  medial portion.  MUSCULOSKELETAL:  Without joint deformity.  SKIN:  Warm and dry.  NEUROLOGICAL:  She is alert and oriented x3.  Cranial nerves II-XII  grossly intact.  VASCULAR:  Distal pulses are intact.   Electrocardiogram in the office today reveals normal sinus rhythm, heart  rate of 83, normal axis, no acute changes.   ASSESSMENT AND PLAN:  1. Chest pain and shortness of breath in an 77 year old female patient      with a history of coronary artery disease status post bypass      surgery in 2002 and a nonischemic Cardiolite November 2007 with      overall preserved LV function.  Her symptoms are certainly      concerning for ischemia.  There may be a pulmonary component to      this as well.  She does have chronic obstructive pulmonary disease.      She does have a history of leg pain recently and there is a      questionable palpable cord over her right thigh.  Will need to rule      out the possibility of deep venous thrombosis/pulmonary embolism as      well.  She will be admitted to Dr. Jeannette How service to [facility].  She will be treated with Lovenox 0.75 mg subcu q.12      hours per pharmacy.  She will continue on aspirin and pindolol      therapy.  Nitroglycerin will not be used as the patient reports a      significant history of hypotension.  She is actually afraid to use      p.r.n. nitroglycerin for her chest symptoms.  Her nebulizers will      be continued.  Chest x-ray will be checked.  If she rules out for      myocardial infarction by enzymes we will set her up for an      adenosine Cardiolite tomorrow to rule out ischemia.  2. Chronic obstructive pulmonary disease.  As noted above, the      patient's nebulizers will be continued.  There is no reason to      suggest chronic obstructive pulmonary disease exacerbation at this      time.  If her cardiac workup remains negative we may want to      consider inpatient  evaluation with pulmonology.  3. Right  leg pain with questionable palpable cord overlying the thigh.      Rule out deep venous thrombosis.  Lower extremity venous Dopplers      will be checked as well as a D-dimer.  4. Hypothyroidism.  Continue her Synthroid therapy.  5. Gastroesophageal reflux disease.  Continue Nexium.  6. Hypertension.  This is fairly well-controlled.  Her pindolol will      be continued.  7. Moderate mitral regurgitation.  Given her dyspnea, we will also      perform a re-look echocardiogram to reassess her LV function as      well as her mitral regurgitation.   DISPOSITION:  As noted, the patient will be sent to [facility]  and be admitted to Dr. Jeannette How service.  Dr. Andee Lineman has spoken to Dr.  Dimas Aguas and he has also seen the patient today.  We will follow the  patient throughout her admission.  Further recommendations to follow  once further testing is completed.      Tereso Newcomer, PA-C  Electronically Signed      Learta Codding, MD,FACC  Electronically Signed   SW/MedQ  DD: 08/31/2008  DT: 08/31/2008  Job #: [id number]

## 2011-04-09 NOTE — Assessment & Plan Note (Signed)
[facility]                          [facility] NOTE   NAME:YOUNGRolonda, Pontarelli                        MRN:          [id number]  DATE:01/21/2008                            DOB:          Mar 20, 1920    CARDIOLOGIST:  Dr. Lewayne Bunting.   PRIMARY CARE ASSISTANT:  Dr. Selinda Flavin.   REASON FOR VISIT:  1 year follow-up.   HISTORY OF PRESENT ILLNESS:  Ms. Apsey is a very pleasant 77 year old  female patient with a history of coronary disease status post CABG who  presents to office day for follow-up.  She also has a significant  history of COPD.  She is followed by Dr. Orson Aloe.  Since last being  seen in November 2007, she has presented to [facility] emergency  room on one occasion with a COPD exacerbation.  She has had her inhalers  changed on a couple of occasions.  She tells me that Dr. Dimas Aguas recently  discontinued her Spiriva, placed her on albuterol/Atrovent nebulizers as  well as ProAir inhaler as needed.  She continues to note significant  dyspnea with exertion.  Overall this is fairly stable.  She denies any  chest discomfort.  She denies any syncope.  She occasionally has to  sleep on two pillows.  She denies any PND.  She has occasional pedal  edema.  Her most significant symptom today is that of bilateral  extremity pain.  From the knees down, she notes numbness and pain.  This  occurs at rest as well as with exertion.  She is concerned about  circulation problems.   Medications:  Nexium 40 mg daily, Darvocet p.r.n., Detrol LA 4 mg daily, calcium, fish  oil, over-the-counter potassium 99 mg b.i.d. vitamin D daily, aspirin 81  mg daily, Synthroid 112 mcg daily, Evista 60 mg daily, B12 tablets,  ipratropium/albuterol nebulizers 3 times a day   ALLERGIES:  PENICILLIN.  She is intolerant to all STATINS.   PHYSICAL EXAM:  She is well-nourished, well developed female in no acute  distress.  Blood pressure 123/79, pulse 100,  weight of 148.2 pounds.  HEENT is normal  NECK:  without JVD  ENDOCRINE:  without thyromegaly.  CARDIAC:  Normal S1-S2.  Distant heart sounds, regular rate and rhythm,  1/6 systolic ejection murmur heard best at the apex.  LUNGS:  Decreased breath sounds bilaterally.  No rales.  ABDOMEN:  Soft, nontender.  EXTREMITIES:  Without edema.  VASCULAR:  Femoral pulses are 2+ bilateral without bruits.  Dorsalis  pedis and posterior tibialis 1+ bilaterally.  SKIN:  Warm and dry.   Electrocardiogram reveals sinus rhythm, HR 92, normal axis, no acute  changes   IMPRESSION:  1. Coronary artery disease      a.     Status post CABG in 2002.      b.     Cardiac catheterization August 2004 with stable anatomy and       possible ischemia in the first diagonal branch distribution.      c.     Cardiolite study November 2007 revealing no ischemia EF 57%.  2. Preserved LV function.  3. Moderate mitral regurgitation/mild aortic insufficiency.  4. COPD followed by Dr. Orson Aloe.  5. Treated hypothyroidism.  6. History of vasovagal syncope.  7. Hypertension.  8. Hyperlipidemia - Intolerant to multiple Statins.  9. Bilateral lower extremity leg pain - question claudication.   DISCUSSION:  Ms. Jain presents for annual follow-up.  Overall she seems  to be stable.  She denies any exertional chest pain.  She continues to  have exertional shortness of breath but this is related to her COPD.  She has complaints of bilateral lower extremity leg pain.  Her symptoms  sound more consistent with neuropathy then with claudication but she  certainly does have risk factors for peripheral arterial disease.   PLAN:  1. Reinitiate pindolol 2.5 mg b.i.d..  She ran out of this some months      ago and has not gotten refills.  She was tolerating it well per her      report.  2. We will set her up for lower extremity arterial ultrasound and ABIs      to rule out the possibility of peripheral arterial disease.  As       long as this is low risk or normal, we will refer back to Dr.      Dimas Aguas for further evaluation of leg pain.  3. She will return for follow-up the next 6 months or sooner p.r.n.      Tereso Newcomer, PA-C  Electronically Signed      Learta Codding, MD,FACC  Electronically Signed   SW/MedQ  DD: 01/21/2008  DT: 01/22/2008  Job #: [id number]   cc:   Selinda Flavin

## 2011-04-12 NOTE — Discharge Summary (Signed)
NAME:  Carmen Vaughn, Carmen Vaughn                           ACCOUNT NO.:  [id number]   MEDICAL RECORD NO.:  [id number]                   PATIENT TYPE:  INP   LOCATION:  2022                                 FACILITY:  [facility]   PHYSICIAN:  Learta Codding, M.D. [facility]             DATE OF BIRTH:  1920/01/02   DATE OF ADMISSION:  07/25/2002  DATE OF DISCHARGE:  07/29/2002                           DISCHARGE SUMMARY - REFERRING   HISTORY OF PRESENT ILLNESS:  The patient is an 77 year old white female who  has not been feeling well for the last month or so.  She also describes  several days of fatigue and definitely not feeling well two days prior to  admission.  Over the preceding two days, she has described increasing  substernal chest tightness, particularly with exertion and shortness of  breath.  She complains of minor lower extremity edema, intermittent  palpitation.  On the day of admission she had a severe episode of substernal  tightness which lead her to [facility].  She received nitroglycerin  and morphine with improvement and the patient was transferred to [facility] for further evaluation.  It is noted that she had a  recent Cardiolite in the [place], [place], office by Dr. Andee Lineman which  supposedly showed reversible changes; however, the detailed report is not  available.   Her history is notable for bypass surgery in November of 2002.  She had a  LIMA to the LAD, saphenous vein graft to the diagonal.  Her catheterization  prior to bypass showed EF of 60%, 30% left main, 70% ostial LAD, 80% LAD,  50% mid LAD, 40% ostial circumflex, small nondominant right with a 98% in-  stent stenosis.  This was also postoperatively complicated by atrial  fibrillation.  She also has a history of ERCP for stricture of the common  bile duct in February 2001 and in June 2001.  She has hypertension,  hyperlipidemia, H. pylori positive and is on Protonix.   LABORATORY DATA:  At  [facility], sodium was 140, potassium 4.1, BUN  25, creatinine 1.0, glucose 109.  Admission CK and troponin at [facility] were negative.  H&H was 12.5 and 36.9, normal indices, platelets  189, wbc 6.1.  At [facility] TSH was 4.995. Fasting  lipids showed total cholesterol 186, triglycerides 68, HDL 69, LDL 103.  Subsequent enzymes and troponins were negative for myocardial infarction.  BNP was 64.  Admission sodium at [facility] was 139,  potassium 4.2, BUN 22, creatinine 0.9, glucose 100.  Normal LFTs.  Her total  protein and albumin were slightly low at 5.8 and 3.0.  Admission PT was 14.6  with PTT of 37.  D-dimer was 0.71.  H&H was 11.0 and 32.0, normal indices,  platelets 152, wbc 6.3.   CT of the chest did not show any evidence  of pulmonary embolus.  She did  have bibasilar atelectasis and probably mild COPD.   EKG showed normal sinus rhythm, early repolarization, nonspecific STT  changes.   HOSPITAL COURSE:  The patient was transferred to [facility] on July 24, 2002.  It was noted that prior to her admission, Dr.  Andee Lineman had held her Zocor because of muscle aches and Toprol for unknown  reason. After admission at [facility], her beta-  blockers were resumed.  CT scan was performed which was negative for  pulmonary embolism.  Cardiac catheterization was performed on July 28, 2002, by Dr. Andee Lineman.  According to his progress note, her EF was within  normal limits, she had a focal 20 to 30% left main lesion, proximal 50% LAD,  mid 70% LAD, distal 60% LAD.  The LIMA to the LAD was patent with saphenous  vein graft to the diagonal II was patent with a 50% distal stenosis and felt  to be small.  The OM had a 50% lesion. The PDA had a 50 to 70% lesion in the  distal portion was on bypass.  The RCA stent was patent with a distal 70%  lesion without gradient.  It was felt that she  should continue medical  treatment.  Her beta-blocker was increased and Imdur was added.  Post sheath  removal and bedrest, she was ambulating the hall without difficulty.  Overnight, she did well.  It was noted that her blood pressure was in the  90s and prior to discharge, Dr. Andee Lineman gave her some fluid.  He noted to  continue medical treatment and wants to check her hemoglobin in one week.   DISCHARGE DIAGNOSES:  1. Chest discomfort of undetermined etiology.  2. Slight progression of coronary artery disease as previously described.  3. Normocytic anemia.  4. History as previously.   DISCHARGE MEDICATIONS:  1. She received a new prescription for Imdur 60 mg q.d.  2. She was asked to increase her Toprol XL to 50 mg q.d.  3. She was asked to begin taking fish oil everyday.  4. She was asked to continue her home medications which include:     a. Baby aspirin 81 mg q.d.     b. Evista 60 mg q.d.     c. Detrol LA 4 mg q.d.     d. Protonix 40 mg q.d.     e. Synthroid 100 mcg q.d.     f. Sublingual nitroglycerin as needed.  5. She was asked to continue to hold her Zocor.   ACTIVITY:  She was advised no lifting, driving or sexual activity or heavy  exertion for two days.   DIET:  She is to maintain a low salt, fat, cholesterol diet.   SPECIAL INSTRUCTIONS:  If she has any problems with her catheterization  site, she was asked to call immediately.   FOLLOW UP:  She already has a scheduled appointment with Dr. Andee Lineman on  Monday, August 02, 2002, at 2:45 p.m.  When she follows up in the office,  she will need a CBC to follow up on normocytic anemia.  Consideration at the  office visit should also involve possible resuming of a different statin and  follow closely in regard to her history of hyperlipidemia.      Joellyn Rued  Learta Codding, M.D. [facility]    EW/MEDQ  D:  07/29/2002  T:  07/30/2002  Job:  [id number]  cc:   [facility]  [address]  [address]  [place], [place]   Dr. Carlynn Spry, [place]

## 2011-04-12 NOTE — Discharge Summary (Signed)
[place]  Patient:    Carmen Vaughn, Carmen Vaughn Visit Number: [id number] MRN: [id number]          Service Type: MED Location: 2000 2024 01 Attending Physician:  Mikey Bussing Dictated by:   Loura Pardon, P.A.C. Admit Date:  09/29/2001 Discharge Date: 10/14/2001   CC:         Lewayne Bunting, M.D. [facility], [place], [place]  Dr. Selinda Flavin, primary caregiver, [place], [place]  Barbette Hair. Arlyce Dice, M.D. [facility], GI specialist   Discharge Summary  DATE OF BIRTH:  [birth year].  DISCHARGE DIAGNOSES: 1. Class IV progressive angina with severe two vessel atherosclerotic    coronary artery disease. 2. Atrial fibrillation postoperative day #2, quick to convert to sinus rhythm    after treatment with IV amiodarone and Lopressor. 3. Acute blood loss anemia postoperatively, no transfusion, treated with iron    supplement. 4. Allergy to Rosuvastatin/myalgias, no increase in liver function studies,    holding on statin in postoperative period.  SECONDARY DIAGNOSES: 1. Status post ERCP x 28 December 1999 and June 2001 for stricture of the    common bile duct, common hepatic duct and the ____. Brushings show mild    atypia.  Follow-up computed tomogram of the abdomen May 2002 shows stable    picture of the pancreas. 2. Hypertension. 3. Known history of atherosclerotic coronary artery disease, status post    right coronary artery stenting 2000. 4. Hyperlipidemia. 5. Positive serology for Helicobacter pylori, asymptomatic, treated only    with Protonix; antibiotic therapy not contemplated at present.  PROCEDURE: 1. September 30, 2001, left heart catheterization per Bruce R. Juanda Chance, M.D. This    study demonstrated that the left main had a 30% stenosis, the left    anterior descending coronary artery had a 70% ostial stenosis and 80%    proximal and a 50% mid point stenosis. The left circumflex had a 40%    ostial stenosis. The right coronary artery was small, nondominant and had  a 90% in-stent stenosis, ejection fraction of 60%. Initial treatment was    to pursue medical treatment; however, Carmen Vaughn experienced accelerating angina    in the next few days and Mikey Bussing, M.D., of the [facility] was consulted to discuss the prospects of    revascularization surgery with the patient. 2. October 05, 2001, precoronary artery bypass graft surgery Doppler studies;    ankle brachial indices are greater than 1.0 bilaterally.  There was no    hemodynamically significant internal carotid artery stenosis bilaterally. 3. October 06, 2001, coronary artery bypass graft surgery x 2 per Dr. Donata Clay.  In this procedure, the left internal mammary artery was connected    in an end-to-side fashion to left anterior descending coronary artery and    a reverse saphenous vein graft was fashioned from the aorta to the    diagonal. The patient tolerated the procedure well.  Her postoperative    convalescence lasted eight days.  DISCHARGE DISPOSITION:  Carmen Vaughn was ready for discharge postoperative day #8. Carmen Vaughn has been afebrile in the postoperative period.  He mental status has been clear.  Carmen Vaughn has made good progress in reconditioning postoperatively. Carmen Vaughn was extubated on the day of surgery. Carmen Vaughn was achieving 91% oxygen saturations on postoperative day #1. Carmen Vaughn did require intermittent oxygen support in the postoperative period.  On postoperative day #2, Carmen Vaughn experienced atrial fibrillation which was  treated with IV amiodarone and Lopressor.  This converted quickly to a sinus rhythm and Carmen Vaughn maintained a sinus rhythm for the next six days of her convalescence.  At discharge Carmen Vaughn is ambulating progressively greater distances without desaturation.  Carmen Vaughn has had some hypotension in the last two days prior to discharge and her Lopressor has been held. Carmen Vaughn will go home on a low dose of Toprol XL 25 mg daily.  Carmen Vaughn was some eight to 10 pounds  fluid overload postoperatively and this was corrected to her preoperative weight by gentle diuresis and electrolyte balance maintenance.  Carmen Vaughn did experience acute blood loss anemia postoperatively. Her hemoglobin was followed on a serial basis but did not require transfusion and Carmen Vaughn has been supplemented with iron in the postoperative period. Carmen Vaughn goes home on the following medications.  DISCHARGE MEDICATIONS:  1. Darvocet-N 100 one to two tablets p.o. q.4-6h. p.r.n. pain.  2. Aspirin 325 mg daily, enteric coated.  3. E-Vista 60 mg daily.  4. Detrol 4 mg daily.  5. Protonix 40 mg daily.  6. Synthroid 100 mcg daily.  7. Amiodarone 200 mg b.i.d.  8. Toprol XL 25 mg daily.  9. Niferex 150 mg daily. 10. Colace 100 mg b.i.d.  DISCHARGE ACTIVITY:  Ambulation to build up strength.  Carmen Vaughn is asked not to lift more than 10 pounds, nor to drive until Carmen Vaughn sees Dr. Donata Clay.  DISCHARGE DIET:  Low fat, low sodium diet.  WOUND CARE:  Carmen Vaughn may shower daily, keeping her incisions clean and dry.  A home health nurse will remove staples in the leg on Wednesday, October 21, 2001, as well as sutures in the chest.  FOLLOW-UP:  Carmen Vaughn will see Lewayne Bunting, M.D., in [place], [place], on October 29, 2001, Thursday, at 11:45 in the morning.  Carmen Vaughn sees Dr. Donata Clay three weeks after discharge. Dr. Vincent Gros office will call with that appointment.  BRIEF HISTORY:  Carmen Vaughn is a 76 year old female. Carmen Vaughn presented to [facility] on September 28, 2001, with new onset chest pain which developed three to four days prior to her presentation.  It was worse with exertion such as housework, vacuuming and after meals.  Carmen Vaughn also has dyspnea on exertion and felt Carmen Vaughn had a virus or a cold but the condition did not improve so Carmen Vaughn presented to her local physician who sent her to the emergency room at [facility].  Carmen Vaughn had an electrocardigram and cardiac enzymes which were  within normal limits.  A cardiology  consultation was placed.  Dr. Andee Lineman had seen her and felt that Carmen Vaughn had symptoms of unstable angina and decided to admit the patient to [facility] for further evaluation and treatment.  Carmen Vaughn does have a known history of atherosclerotic coronary artery disease and Carmen Vaughn is status post placement of the right coronary artery stent to a nondominant right coronary artery by Dr. Juanda Chance two years ago.  At that time Carmen Vaughn also had a 50 to 60% stenosis of the left anterior descending diagonal vessel.  HOSPITAL COURSE:  This is as described in discharge disposition. Dictated by:   Loura Pardon, P.A.C. Attending Physician:  Mikey Bussing DD:  10/14/01 TD:  10/15/01 Job: [id number]

## 2011-04-12 NOTE — Consult Note (Signed)
[place]. [facility]  Patient:    Carmen Vaughn, Carmen Vaughn Visit Number: [id number] MRN: [id number]          Service Type: MED Location: 2300 [id number] Attending Physician:  Mikey Bussing Dictated by:   Mikey Bussing, M.D. Proc. Date: 10/04/01 Admit Date:  09/29/2001   CC:         CVTS Office  [facility]   Consultation Report  REASON FOR CONSULTATION:  Refractory angina with two-vessel coronary artery disease.  REQUESTING PHYSICIAN:  [facility] (Dr. Juanda Chance and Dr. Andee Lineman).  PRIMARY CARE PHYSICIAN:  Selinda Flavin, M.D., [place], [place].  CHIEF COMPLAINT:  Chest pain.  HISTORY OF PRESENT ILLNESS:  I was asked to see this 77 year old white female in consultation by the [facility] cardiologists for evaluation of surgical coronary revascularization.  The patient presented to [facility] on September 28, 2001, with new-onset chest pain which developed three to four days prior to her presentation and was worse with exertion such as housework and vacuuming and worse after meals.  She also had dyspnea on exertion, and she felt she had a virus or cold, but the condition did not improve so she presented to her local physician, who sent her to the emergency room at [facility].  She had EKG and enzymes which were within normal limits, and a cardiology consultation was placed.  Dr. Andee Lineman felt that she had symptoms of unstable angina, and decided to admit the patient for further evaluation and treatment since she did have a known history of coronary artery disease and was status post placement of a right coronary stent to a nondominant right by Dr. Juanda Chance two years ago and at that time had 50-60% stenosis of the LAD diagonal.  The patient was subsequently transported to [facility] and underwent cardiac catheterization by Dr. Juanda Chance.  This demonstrated a total occlusion of the nondominant right coronary, 80-90% stenosis of the  LAD diagonal which was a long lesion and was ulcerated and not amenable to percutaneous intervention.  Her chest pain improved on heparin, and she was evaluated with a stress Cardiolite.  The stress Cardiolite showed she had apical anterior ischemia with stress.  Based on the Cardiolite results, her symptoms, and the coronary anatomy on catheterization, she was referred for surgical coronary revascularization.  PAST MEDICAL HISTORY: 1. History of coronary artery disease, status post right coronary artery    stent in 2000. 2. 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 Arlyce Dice approximately two years ago. 3. Ejection fraction of 40-50% by previous Cardiolite scan.  ALLERGIES:  FOSAMAX intolerance.  Allergy to PENICILLIN.  HOME MEDICATIONS: 1. Detrol LA 4 mg q.d. 2. Protonix 40 mg q.d. 3. Darvocet-N 100 p.r.n. 4. Synthroid 0.1 mg q.d. 5. Evista 60 mg q.d. 6. Aspirin 1 q.d.  SOCIAL HISTORY:  The patient is widowed and has a daughter, Mrs. Sherrie Mustache.  She lives in [place] and has her own house.  She does not smoke or use alcohol.  FAMILY HISTORY:  Positive for coronary artery disease.  PAST SURGICAL HISTORY:  Negative except for the coronary stent and the bile duct stent.  REVIEW OF SYSTEMS:  The patient is left-hand dominant.  She denies any recent weight loss or fever or constitutional symptoms of significant weakness.  She has had dyspnea on exertion and exertional chest pain, as related in history of present illness.  She denies any change in vision, headaches,  but she has had trouble with sleep.  She denies difficulty swallowing, difficulty with wheezing or hemoptysis.  CARDIAC:  Positive for angina, but negative for symptoms of congestive failure.  ABDOMEN:  Positive for stent in her distal bile duct which has been treated with a careful follow-up by Dr. Arlyce Dice, and she has been doing well without abdominal or digestive complaints.  There  has been no evidence of malignancy of the stricture at that area.  RENAL: Negative for hemoptysis, but is positive for stress incontinence. HEMATOLOGIC:  Negative for bleeding diathesis or easy bruisability. ENDOCRINE:  Positive for hypothyroid, negative for diabetes.  Review of systems is otherwise negative.  PHYSICAL EXAMINATION:  VITAL SIGNS:  The patient is 5 feet 6 inches and weighs approximately 150 pounds.  Blood pressure 110/60, heart rate 70 per minute, respirations 18 and unlabored.  GENERAL:  She is currently in the cardiology stepdown unit, being monitored for her angina and know coronary artery disease.  Well-nourished white female in no distress, lying in her hospital bed, accompanied by her daughter.  HEENT:  Normocephalic, full EOMs.  Conjunctivae clear.  NECK:  Supple.  Without JVD, thyromegaly, mass, or carotid bruit.  LUNGS:  Breath sounds clear and equal bilaterally, without wheeze.  There is no thoracic deformity.  CARDIAC:  Regular rate and rhythm.  Without S3, gallop.  There is a systolic murmur at the left lower sternal border.  ABDOMEN:  Soft and nontender.  Without mass, organomegaly, or bruit.  EXTREMITIES:  Without edema, clubbing, or tenderness.  There are no swollen, tender joints.  Vascular exam is 2+ bilaterally in the radial, fetal movement, and pedal areas.  There is no evidence of venous insufficiency the lower extremities.  RECTAL:  Deferred.  SKIN:  Warm, clear, and dry.  Without rash or lesion.  NEUROLOGIC:  Alert and oriented x 3.  With full motor function.  No focal deficit.  LABORATORY DATA:  Creatinine 0.8.  LFTs within normal limits.  Hematocrit 32%.  Chest x-ray:  No active disease.  I reviewed her coronary arteriograms and review of the interpretation of severe disease of the LAD diagonal.   IMPRESSION AND RECOMMENDATIONS:  Surgical coronary revascularization would help relieve this patients symptoms of angina, preserve  her ventricular function of the anterior wall and, hopefully, improve her survival and keep her free of cardiac problems for several years.  I discussed the indications and benefits of coronary bypass surgery with the patient and her daughter.  I discussed the major aspects of the operation including the location of the surgical incisions, the use of general anesthesia and cardiopulmonary bypass, the choice of conduit for the bypasses, and the expected hospital recovery.  I reviewed with the patient and her daughter the risks associated with the operation to the patient including risks of MI, CVA, bleeding, infection, and death.  She understands that the alternative to surgical therapy would be medical therapy and the expected outcome of that alternative.  She agrees to proceed with the operation as planned under informed consent.  Thank you very much for this consultation. Dictated by:   Mikey Bussing, M.D. Attending Physician:  Mikey Bussing DD:  10/06/01 TD:  10/06/01 Job: [id number]

## 2011-04-12 NOTE — Op Note (Signed)
[name]. [facility]  Patient:    Carmen Vaughn, Carmen Vaughn                          MRN: [id number] Proc. Date: 05/04/00 Attending:  Barbette Hair. Arlyce Dice, M.D. [facility] CC:         Dr. Selinda Flavin             Dr. Lewayne Bunting                           Operative Report  ERCP NOTE  HISTORY:  Ms. Pudwill has a stricture of both the pancreatic and bile ducts. She was stented in February 2001 for presumed pancreatic cancer.  This evolved examination for stent replacement.  Last CT on April 3 showed fullness in the uncinate process of the pancreas with stable pancreatic ampullary dilatation.  INFORMED CONSENT:  Patient provided consent after risks, benefits, and alternatives were explained.  MEDICATIONS:  Versed 5, Fentanyl 100, Robinul 0.2 mg IV, and Cipro 400 mg IV. Throat was sprayed with Cetacaine spray.  PROCEDURE:  Patient was placed in the prone position, administered continuous low-flow oxygen, was placed on pulse oximetry.  The Olympus video duodenoscope was inserted blindly into the oropharynx and esophagus.  Scope was passed through the stomach and then on to the duodenum, where a biliary stent was seen in place.  This was removed via an endoscopic snare.  After inserting the duodenoscope, scope was passed through the duodenum.  A cytology was taken of the distal bile duct stricture.  Following this, the bile duct was selectively cannulated and again demonstrated a 3 cm in length stricture of the very distal common bile duct with moderate dilatation of the duct proximal to the stricture.  A #10 French 5 cm biliary stent was placed through the stricture. Prompt emptying of contrast dye in bowel was observed.  IMPRESSION:  Distal common bile stricture - probably secondary to pancreatic CA.  RECOMMENDATIONS: 1. Continue Cipro 500 mg b.i.d. for three days. 2. Follow up CT of the abdomen. DD:  05/05/00 TD:  05/07/00 Job: [id number]

## 2011-04-12 NOTE — Assessment & Plan Note (Signed)
[facility]                         GASTROENTEROLOGY OFFICE NOTE   NAME:Carmen Vaughn, Carmen Vaughn                        MRN:          [id number]  DATE:02/23/2007                            DOB:          [birth year]    PROBLEM:  Bile duct stricture.   Carmen Vaughn has returned for reevaluation.  She has benign distal bile  duct stricture for which she has a stent in place.  The stent was last  changed in January 2006.  Carmen Vaughn is complaining of some right upper  quadrant pain.  She is without fever, pruritus or jaundice.   MEDICATIONS:  Include Nexium, Darvocet, baby aspirin, Reminyl,  Synthroid, Evista, and albuterol inhaler.  She is allergic to  PENICILLIN.   PHYSICAL EXAMINATION:  VITAL SIGNS:  Pulse 68, blood pressure 114/64,  weight 151.  HEENT:  EOMI. PERRLA. Sclerae are anicteric.  Conjunctivae are pink.  NECK:  Supple without thyromegaly, adenopathy or carotid bruits.  CHEST:  Clear to auscultation and percussion without adventitious  sounds.  CARDIAC:  Regular rhythm; normal S1 S2.  There are no murmurs, gallops  or rubs.  ABDOMEN:  She has mild right upper quadrant tenderness without guarding  or rebound.  There are no abdominal masses or organomegaly.  EXTREMITIES:  Full range of motion.  No cyanosis, clubbing or edema.  RECTAL:  Deferred.   IMPRESSION:  Benign biliary stricture with likely clogged biliary stent.   RECOMMENDATION:  ERCP with stent removal.  I will determine at the time  of her procedure whether or not a replacement stent is required.     Barbette Hair. Arlyce Dice, MD,FACG  Electronically Signed    RDK/MedQ  DD: 02/23/2007  DT: 02/23/2007  Job #: [id number]   cc:   Selinda Flavin

## 2011-04-12 NOTE — Op Note (Signed)
. [facility]  Patient:    Carmen Vaughn, Carmen Vaughn Visit Number: [id number] MRN: [id number]          Service Type: Attending:  Mikey Bussing, M.D. Dictated by:   Mikey Bussing, M.D. Proc. Date: 10/06/01   CC:         Daisey Must, M.D. [facility]   Operative Report  PREOPERATIVE DIAGNOSIS:  Class IV progressive angina with severe two-vessel coronary artery disease.  POSTOPERATIVE DIAGNOSIS:  Class IV progressive angina with severe two-vessel coronary artery disease.  PROCEDURE:  Coronary artery bypass grafting x 2 (left internal mammary artery to left anterior descending coronary artery, saphenous vein graft to diagonal).  SURGEON:  Mikey Bussing, M.D.  FIRST ASSISTANT:  Salvatore Decent. Dorris Fetch, M.D.  SECOND ASSISTANT:  Dominica Severin, P.A.  ANESTHESIA:  General by Maren Beach, M.D.  INDICATION:  The patient is a 77 year old female with a history of coronary artery disease and coronary intervention, who presented to the [facility] with acute onset of chest pain and was ruled out for MI.  A cardiology consultation by Dr. Andee Lineman determined she had unstable angina.  She was transferred to [facility], where she underwent cardiac catheterization by Dr. Juanda Chance.  This demonstrated high-grade long stenosis of the LAD diagonal, which was not amenable to angioplasty.  Her ventricular function was well-preserved.  She has an occluded nondominant right coronary artery.  She was referred for a surgical coronary revascularization after a Cardiolite scan showed stress-induced anterior-apical ischemia.  Prior to the operation I examined the patient in her hospital room and reviewed the results of the cardiac catheterization with the patient and her daughter, Mrs. Sherrie Mustache.  I discussed the indications and expected benefits of coronary bypass surgery.  I discussed the major aspects of the operation, including the location of the surgical  incisions, the choice of conduit, the use of general anesthesia and cardiopulmonary bypass, and the expected postoperative recovery period.  I reviewed the risks associated with the operation with the patient, including risks of MI, CVA, bleeding, infection, and death.  I discussed the alternatives to surgical therapy for treatment of her coronary disease and the expected outcome of those alternative therapies. She understood these aspects of the operation and agreed to proceed with the operation as planned under informed consent.  OPERATIVE FINDINGS:  The patients heart was extremely friable and had very delicate epicardial fat that would tear quite easily.  The diagonal vessel was small with a diffuse disease pattern.  The vein was harvested from the right thigh, which was of average to above-average quality.  The mammary artery was a good conduit, slightly thickened but with excellent flow.  The nondominant right coronary was too small to graft.  DESCRIPTION OF PROCEDURE:  The patient was brought to the operating room and placed supine on the operating room table, where general anesthesia was induced under invasive hemodynamic monitoring.  The chest, abdomen, and legs were prepped with Betadine and draped as a sterile field.  A sternal incision was made as the saphenous vein was harvested from the right thigh.  The internal mammary artery was harvested as a pedicle graft from its origin at the subclavian vessels.  It had excellent flow.  Heparin was administered, and the ACT was documented as being therapeutic.  A sternal retractor was placed. The pursestrings placed in the ascending aorta and right atrium.  The patient was cannulated, placed on bypass.  The coronaries were identified and were  deeply embedded in friable epicardial fat.  The mammary artery and vein graft were prepared for the distal anastomoses, and a cardioplegia cannula was placed.  The patient was cooled to 30  degrees as the aortic crossclamp was applied.  Five hundred cubic centimeters of cold blood cardioplegia was delivered through the aortic root with immediate cardioplegic arrest and septal temperature dropping to less than 14 degrees.  Topical iced saline slush was used to augment myocardial preservation, and a pericardial insulator pad was used to protect the left phrenic nerve.  The distal coronary anastomoses were then performed.  The first distal anastomosis was to the first diagonal.  This was a 1.2 mm vessel with proximal 90% stenosis, and a reversed saphenous vein was sewn end-to-side with running 7-0 Prolene to this very thin-walled vessel.  The second distal anastomosis was at the distal LAD, where it was a 1.5 mm vessel with proximal 90% stenosis.  The left internal mammary artery pedicle was brought through an opening created in the left lateral pericardium and was brought down on the LAD and sewn end-to-side with running 8-0 Prolene.  There was excellent flow through the anastomosis with immediate rise in septal temperature after release of the pedicle clamp on the mammary artery.  The mammary pedicle was secured to the epicardium, and the aortic crossclamp was removed.  The heart was cardioverted back to a regular rhythm.  A partial occluding clamp was placed on the ascending aorta, and a proximal vein anastomosis was performed using a 4.0 mm punch and running 6-0 Prolene.  The partial clamp was removed, and the vein graft was perfused.  Both grafts had good flow, and hemostasis was documented at the proximal and distal anastomoses.  The patient was rewarmed to 37 degrees, and temporary pacing wires were applied.  The lungs were re-expanded and the ventilator was resumed.  The patient was then weaned from bypass without inotropes without difficulty with stable blood pressure and cardiac output.  Protamine was administered, and the cannulas were removed.  The mediastinum was  irrigated with warm antibiotic irrigation.  The leg incision was irrigated and closed over a Jackson-Pratt drain.  The pericardium was loosely reapproximated.  Two mediastinal and a left pleural chest tube were placed and brought out through separate incisions.  The sternum was reapproximated with interrupted steel wire.  The pectoralis fascia was closed with interrupted #1 Vicryl.  The subcutaneous layer and skin were closed with a running Vicryl.  Sterile dressings were applied.  Total bypass time was 80 minutes with a crossclamp time of 30 minutes.  The patient was returned to the ICU in stable condition. Dictated by:   Mikey Bussing, M.D. Attending:  Mikey Bussing, M.D. DD:  10/06/01 TD:  10/07/01 Job: [id number]

## 2011-04-12 NOTE — Discharge Summary (Signed)
[place]. [facility]  Patient:    Carmen Vaughn, Carmen Vaughn                          MRN: [id number] Adm. Date:  01/15/00 Disc. Date: 01/19/00 Attending:  Lewayne Bunting, M.D. Dictator:   Leonides Cave, P.A. CC:         Selinda Flavin, M.D., Primary Care Physician             Barbette Hair. Arlyce Dice, M.D. [facility]             Lewayne Bunting, M.D., [facility], [phone] [address] [place], [place] [address]                           Discharge Summary  DATE OF BIRTH:  1920/08/21  DISCHARGE DIAGNOSES: 1. Chest pain, unclear etiology in patient with known coronary artery disease in    the past.  Cardiolite scan on January 19, 2000, revealed a possible small    amount of inferolateral ischemia, thought ejection fraction was 61%, and it as    felt the patient should be continued on medical therapy. 2. Chronic urologic abnormalities including stones, left renal atrophy, and    bilateral hydronephrosis with right ureterectasis and urge incontinence. 3. Pancreatic duct and common bile duct strictures without liver function test    changes status post common bile duct stent with good biliary drainage, followed    by [facility].  HISTORY:  Carmen Vaughn is a very pleasant 77 year old white female who lives alone. She has a history of coronary artery disease and had an anginal attack back in March 2000.  She had a heart catheterization by Dr. Charlies Constable which showed a 50% ostial LAD, 70% to 80% proximal mid LAD, 50% mid LAD, 50% marginal branch stenosis to the circumflex, 70% posterolateral to 90% nondominant RCA stenosis which was  stented.  Her EF was 60% at that time.  She went to the emergency department at [facility] on January 04, 2000, with substernal tightness and heavy feeling across her chest.  She was transferred to [place] with the impression that she had unstable angina and o rule out for myocardial infarction.  HOSPITAL  COURSE:  A GI consult was called on January 15, 2000, by Dr. Andee Lineman because of an abnormal CT scan.  That was actually done at [facility] which revealed a mild duct with dilatation to the pancreatic duct and more moderate dilatation ;to the the common bile duct.  The common bile duct measured 1.5 cm in diameter. The test was done to rule out PE, and there was no evidence of aortic dissection, aneurysm, or pulmonary embolism.  There was also some left-sided hydronephrosis  with at least a partially obstructive stone involving the proximal left ureter measuring 3 to 4 mm in size.  As mentioned above, the GI team saw the patient in consultation and felt that some of her chest pain could be biliary pain.  An ERCP was planned by the GI team and done on January 16, 2000, by Dr. Arlyce Dice.  The impression from the ERCP by Dr. Arlyce Dice is as follows.  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 Arlyce Dice proceeded to check cytologies.  The report of cytopathology came back  to reveal benign and reactive glandular epithelial cells, and no significant atypia was noted.  Her CEA was less than 0.5.  CT of the pancreas was then performed, and there was no mass in the pancreas, and the stent was in place as it s;hould be.  Dr. Arlyce Dice then felt that no further GI workup needed to be done, and they asked general surgery consult to see the patient.  General surgery consult was obtained on January 18, 2000, and, because of the patients comorbidities, it was felt that the patient was not a good surgical candidate for the Whipple procedure.  Her quality of life was not felt to be much better with the surgery, and nonoperative management was felt to be the best course of action.  The patient was felt to be in stable condition from a GI and general  surgery standpoint, and to rule out further cardiac disease/ischemia, the  patient had an adenosine Cardiolite on January 19, 2000, which revealed an EF of 61%.  The radiologist read the report as a small inferolateral ischemia seen in one view only.  Dr. Chales Abrahams read the report and was not impressed by the scans and believed that a conservative course of medical treatment was the best option.  She was subsequently discharged to home on the following medications.  DISCHARGE MEDICATIONS: 1. Protonix 40 mg q.d. 2. Synthroid as taken at home. 3. Enteric-coated aspirin q.d. 4. Premarin as taken at home.  She was given new prescription for: 5. Baycol 0.4 mg q.h.s. 6. Toprol XL 50 mg 1/2 table q.d.  ACTIVITY:  She can go back to her activity as tolerated.  FOLLOWUP:  She should follow up with Dr. Lewayne Bunting in the [facility] in two to three weeks.  She should call for that appointment.  The number was provided. She has a followup appointment with Dr. Melvia Heaps on Monday, March 26 at 1:30 p.m. at the [facility] building.  She should also follow up with her urologist, Dr. Byrd Hesselbach in [place].  LABORATORY DATA:  The patient had a lipid profile which showed total cholesterol 230, triglycerides 136, HDL 63, and LDL 140.  Cardiac enzymes were all negative. DD:  01/19/00 TD:  01/19/00 Job: [id number]

## 2011-04-12 NOTE — Cardiovascular Report (Signed)
NAME:  Carmen Vaughn, Carmen Vaughn                          ACCOUNT NO.:  [id number]   MEDICAL RECORD NO.:  [id number]                   PATIENT TYPE:  INP   LOCATION:                                       FACILITY:  [facility]   PHYSICIAN:  Learta Codding, M.D.                 DATE OF BIRTH:  January 05, 1920   DATE OF PROCEDURE:  DATE OF DISCHARGE:  07/29/2002                              CARDIAC CATHETERIZATION   PROCEDURE PERFORMED:  1. Left heart catheterization.  Selective coronary angiography.  2. No ventriculography was performed.  3. Graft assessment.   CARDIOLOGIST:  Learta Codding, M.D.   REFERRING PHYSICIAN:  Selinda Flavin, M.D.   DIAGNOSES:  1. Severe native coronary artery disease.  2. Patent graft anatomy.   INDICATIONS:  The patient is an 77 year old female with a history of  coronary artery disease and prior bypass surgery.  She presents with chest  wall and cardiac catheterization has been requested to assess the coronary  anatomy.   DESCRIPTION OF PROCEDURE:  A 6 French arterial sheath was placed using the  modified Seldinger technique in the right femoral artery.  A 6 [place]  and JR4 catheter was used to engage the left and right coronaries.  An IMA  catheter was used for injection of the internal mammary artery.  At the  termination of the procedure, all catheters and sheaths were removed, and  the patient was brought back to the holding area.   FINDINGS NATIVE ANATOMY:  1. The left main coronary artery has an ostial 20-30% stenosis.   1. Left anterior descending coronary artery was a large caliber vessel with     a proximal stenosis of approximately 50% followed by a tandem lesion of     approximately 70% just beyond the first diagonal.  The first diagonal had     a 90% stenosis but was a small vessel.  The distal LAD had a 50% stenosis     just prior to the insertion site of the internal mammary artery.   1. The circumflex artery is a large caliber vessel with a 50%  stenosis in     the obtuse marginal branch and posterior descending artery had a 50-70%     stenosis (left dominant system).   1. The right coronary artery was nondominant.  It had a stent that was     widely patent in the proximal vessel followed by 70% stenosis.  However,     the terminal portion of this vessel was rather small.   GRAFT ANATOMY:  The left internal mammary artery to the LAD was widely  patent.  Saphenous vein graft to the second diagonal branch had  approximately 50% stenosis in the distal part of the vessel.    IMPRESSION/RECOMMENDATIONS:  No definite indication for PCI.  The patient  can be managed medically with increased Lopressor and  continued  IMDUR.  There is no evidence of gradient across the aortic valve.  The patient can  be discharged tomorrow.  Continue risk factor modifications as indicated.                                                 Learta Codding, M.D.    GED/MEDQ  D:  03/27/2003  T:  03/28/2003  Job:  [id number]

## 2011-04-12 NOTE — H&P (Signed)
NAME:  Carmen Vaughn, Carmen Vaughn                           ACCOUNT NO.:  [id number]   MEDICAL RECORD NO.:  [id number]                   PATIENT TYPE:  INP   LOCATION:  NA                                   FACILITY:  [facility]   PHYSICIAN:  Madolyn Frieze. Jens Som, M.D. [facility]         DATE OF BIRTH:  [birth year]   DATE OF ADMISSION:  DATE OF DISCHARGE:                                HISTORY & PHYSICAL   CHIEF COMPLAINT:  Chest pressure and shortness of breath.   HISTORY OF PRESENT ILLNESS:  The patient is an 77 year old female with a  history of CAD status post CABG, postoperative atrial fibrillation,  hypertension, hyperlipidemia, who presents with several days of tiredness  and not feeling well.  She states that approximately two weeks ago she saw  Dr. Andee Lineman, who performed a Cardiolite test which showed reversible changes  by the patient's report.  However, this report is not currently available.  Following this test she was started on Toprol and Zocor.  However, she  continued to not feel well, and this was discontinued approximately two days  prior to admission.  Over the past two days the patient has experienced  increasing substernal chest tightness, particularly with exertion, and  shortness of breath.  She complains of mild lower extremity edema,  intermittent palpitations and today, with her severe episode of substernal  chest tightness which led her to go to [facility], she did  have some shortness of breath which was mostly relieved in the [facility] ER  with nitroglycerin and morphine.  Upon transfer, however, the patient  experienced a recurrence of her symptoms, and the symptoms were still  present upon arrival to [facility] this morning.  The patient was  transferred on IV nitroglycerin, Lovenox, and aspirin.   REVIEW OF SYSTEMS:  Other review of systems are currently negative for the  following:  HEENT, RESPIRATORY, GASTROINTESTINAL, MUSCULOSKELETAL,  NEUROLOGIC,  ENDOCRINE, etc.  No urinary symptoms.   PAST MEDICAL HISTORY:  1. CAD.  The patient underwent CABG in November 2002.  This was following a     catheterization that showed 30% stenosis of the left main, 70% ostial     LAD, 80% proximal LAD, and 50% mid LAD.  The circumflex had a 40% ostial     stenosis.  The right coronary was small and nondominant and had a 98% in-     stent stenosis, and the ejection fraction was calculated at approximately     60%.  The patient was initially felt after her catheterization to respond     to medical therapy.  However, she experienced increasing angina and then     subsequently underwent bypass grafting.  The patient had a LIMA to the     LAD and an SVG to diagonal.  2. No carotid artery stenosis by ultrasound.  3. Normal ankle/brachial indices prior to surgery.  4. Postoperative atrial fibrillation.  5. 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  Brushings showed atypia, and     this has been followed by CT scan.  6. Hypertension.  7. Hyperlipidemia.  8. H. pylori positive.  She has been treated with Protonix.   ALLERGIES:  ROSUVASTATIN  causes myalgias.  PENICILLIN causes a rash.   FAMILY HISTORY:  Her father committed suicide.  Her mother died at age 75 of  atherosclerosis.   SOCIAL HISTORY:  No tobacco.   MEDICATIONS:  1. Aspirin 81 mg q.d.  2. Evista 60 mg q.d.  3. Detrol LA 4 mg q.d.  4. Protonix 40 mg q.d.  5. Synthroid 100 mcg q.d.  6. Toprol XL recently held.  7. Zocor recently held as well.   PHYSICAL EXAMINATION:  VITAL SIGNS:  Blood pressure 97/51, saturation 94% on  2 L, heart rate 68, breathing 20 times per minute.  HEENT:  No icterus.  PERRLA, EOMI.  NECK:  No bruits.  No JVD.  No lymphadenopathy.  With 2+ carotid pulses.  CHEST:  Faint bibasilar rales.  No wheezes or rhonchi.  The patient has  kyphotic spine.  HEART:  Regular rate and rhythm with no  murmurs, rubs, or gallops.  There is  a sternotomy scar in the midline.  ABDOMEN:  Soft, nontender, nondistended.  No hepatosplenomegaly.  No rebound  or guarding.  No femoral bruits.  Femoral pulses are 1+.  EXTREMITIES:  Distal lower extremities are without edema.  No cyanosis or  clubbing.  The patient has 0 to 1+ distal lower extremity pulses.  NEUROLOGIC:  Intact.   LABORATORY DATA:  Glucose 109, BUN 25, creatinine 1.0, calcium 8.9, sodium  140, potassium 4.1, chloride 112, bicarbonate 24.  CPK 60.  White blood  count 61, hemoglobin 12.5, hematocrit 36.9, platelet count 189.  MB 2.2,  troponin I 0.01.   Chest x-ray is under penetrated, PA and lateral film that is difficult to  interpret.   EKG shows normal sinus rhythm.  Review of the [facility] EKG shows very faint  upsloping anteroseptal ST depression.  However, this is a nonspecific  finding.  EKG here is unchanged from previous EKGs done in November 2002,  only showing normal sinus rhythm.  No significant ST or T-wave changes.   ASSESSMENT AND PLAN:  The patient is an 77 year old female with a history of  coronary artery disease, hypertension, hyperlipidemia, who presents with  significant complaints of unstable angina, shortness of breath.  Components  of her exam could be consistent with very mild congestive heart failure.  However, it appears that she is not dramatically volume overloaded.  She has  responded to nitrates and morphine, and her current set of enzymes is  negative.  1. Coronary artery disease:  Will rule out MI in this patient.  We will     adjust her nitroglycerin as needed and continue with morphine as needed     as well.  Serial enzymes will be checked, and we will start very low-dose     oral beta blockers at 12.5 metoprolol b.i.d.  The patient will be watched     closely for further symptoms.  The patient, with this history of a recent     positive Cardiolite, the results of which are unknown at the  present    time, and the unstable symptoms, will most certainly need to undergo  cardiac catheterization within the next two to three days.  We will start     2B-3A if the patient develops EKG changes or positive enzymes.  2. Hypertension:  Stable.  3. Hyperlipidemia:  We will check an a.m. lipid profile.  4.     Hypothyroidism:  We will check TSH and continue the patient's Synthroid.  5. Hyperactive bladder:  Continue Detrol.  6. Gastroesophageal reflux disease:  Continue Protonix.     Heloise Beecham, M.D. [facility]                Madolyn Frieze. Jens Som, M.D. [facility]    DWM/MEDQ  D:  07/25/2002  T:  07/26/2002  Job:  [id number]

## 2011-04-12 NOTE — Assessment & Plan Note (Signed)
[facility]                            [facility] NOTE   NAME:Carmen, Vaughn                        MRN:          [id number]  DATE:10/14/2006                            DOB:          [birth year]    PRIMARY CARE PHYSICIAN:  Selinda Flavin.   HISTORY OF PRESENT ILLNESS:  The patient is an elderly female with known  coronary artery disease.  The patient is status post coronary artery bypass  grafting.  She was recently admitted to [facility] with chest pain  and near syncope which was felt to be related to [facility] that she started  taking as new medication.  .  The patient underwent a Cardiolite study  during this hospitalization. There was no frank ischemia.  The ejection  fraction was normal.  Since that hospitalization, she has been feeling  better.  She reports occasional palpitations but her stated dyspnea has  improved.  The patient does have chronic dyspnea, however, which was felt in  the past to be multifactorial and possibly even related to underlying lung  disease.  The patient appears to be stable.   MEDICATIONS:  Nexium, Darvocet, Detrol, calcium, fish oil, potassium,  vitamin D, aspirin 81 mg a day, Synthroid, Evista, red yeast, B12 tablets as  well as DuoNeb.   PHYSICAL EXAMINATION:  VITAL SIGNS:  Blood pressure 126/70, heart rate 80  beats per minute.  NECK:  Normal carotid upstroke.  No carotid bruits.  LUNGS:  Diminished breath sounds.  There are a few crackles at the bases.  HEART:  Regular rate and rhythm.  Tachycardic.  ABDOMEN:  Soft, nontender.  EXTREMITIES:  No cyanosis, clubbing or edema.   PROBLEMS:  1. Coronary artery disease, stable, status post recent Cardiolite.  2. Status post coronary artery bypass grafting in 2002.  3. Status post cardiac catheterization in 2004 with possible ischemia in      the first diagonal branch.  4. Valvular heart disease, moderate mitral regurgitation.  5. Hypothyroidism.  6. Vasovagal syncope.  7. Crestor intolerance and statin intolerance in general.   PLAN:  1. The patient appears to be stable.  She has improved since her hospital      discharge.  2. Resume pindolol at a low dose of 2.5 p.o. b.i.d. in light of her known      coronary artery disease and known ischemia in the distribution of a      diagonal noted on previous catheterization.  3. The patient can continue on her inhaler treatment at the present time.  4. No further arrhythmia work-up is needed.  It is felt that her syncope      recently was related to her use of DuoNeb.     Learta Codding, MD,FACC  Electronically Signed    GED/MedQ  DD: 10/14/2006  DT: 10/14/2006  Job #: [id number]   cc:   Selinda Flavin

## 2011-04-12 NOTE — Procedures (Signed)
[place]  Patient:    Carmen Vaughn, Carmen Vaughn                        MRN: [id number] Proc. Date: 01/16/00 Adm. Date:  [id number] Attending:  Alric Quan CC:         Cecil Cranker, M.D. [facility]                           Procedure Report  PROCEDURE PERFORMED:  ENDOSCOPIST:  Barbette Hair. Arlyce Dice, M.D. [facility]  HISTORY:  The patient is a 77 year old female admitted with chest pain.  CT at [facility] demonstrated dilatation of the bowel and pancreatic duct.  Test was performed to rule out biliary abnormalities.  INFORMED CONSENT:  The patient provided consent after risks, benefits and alternatives were explained.  MEDICATIONS USED:  Versed 6 mg, fentanyl 80 mcg, Robinul 0.2 mg IV.  DESCRIPTION OF PROCEDURE:  The patient was placed in the prone position, administered continuous low-flow oxygen and was placed on pulse oximetry.  The Olympus video duodenoscope was inserted blindly through the oropharynx and esophagus.  The scope was passed through the stomach and then through the duodenum where a normal-appearing papilla was identified.  The pancreatic duct was selectively cannulated.  There was a 2 to 3 mm proximal  pancreatic duct stricture with dilatation of the duct proximal to the stricture. The common bile duct was selectively cannulated and also demonstrated an approximately 3 to 4 mm very distal bile duct stricture with marked dilatation f the ductal system proximal to the stricture.  A 12 to 15 mm sphincterotomy was made.  Following this, there was very little emptying of contrast material for the more proximal duct.  Following this, a cytology was taken through the bile duct stricture and a #10 French 5 cm biliary stent was placed.  There was prompt emptying of contrast through the biliary stent.  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  RECOMMENDATIONS:  Check cytologies.  If not diagnostic, proceed with dedicated pancreatic CT.  Check CA-99 and CEA levels. DD:  01/16/00 TD:  01/16/00 Job: [id number]

## 2011-04-12 NOTE — Consult Note (Signed)
[place]  Patient:    Carmen Vaughn, Carmen Vaughn                        MRN: [id number] Proc. Date: 01/18/00 Adm. Date:  [id number] Attending:  Alric Quan CC:         Cecil Cranker, M.D. [facility]             Dr. Lorenso Courier in [place], [place].                          Consultation Report  CHIEF COMPLAINT:  "I have some urinary problems."  HISTORY OF PRESENT ILLNESS:  Ms. Littleton is a lovely 77 year old white female who  presented with chest pain, and was found to have a pancreatic duct/common bile uct stricture and has been treated for that.  When a CT scan was performed of that,  multiple changes were noted in the urinary tract, and I have been asked to evaluate her for that.  She, additionally, has had urge incontinence, but it has been very chronic in nature.  She is actively followed by Dr. Frann Rider in [place], [place], and has undergone multiple urologic procedures previously.  She has no flank pain, she has really seen no blood in her urine.  She does have a history of kidney stones, and has had multiple surgeries on her kidneys for that.  ALLERGIES:  PENICILLIN and had hives reaction, and has a BLOOD TRANSFUSION REACTION.  MEDICATIONS:  Synthroid, enteric-coated aspirin, Premarin, Pepcid, Metamucil, vitamins, Darvocet, Tylenol.  PAST MEDICAL AND SURGICAL HISTORY:  She had coronary artery disease.  She has GERD, esophageal reflux, hypothyroidism, osteoporosis.  She had kidney stones and surgery three to four times in the past.  She had an anterior urethropexy.  She has had  esophageal dilatation, hysterectomy, an appendectomy, and fracture of the left ip. She has also had a cardiomegaly, and has the urge incontinence, as noted.  SOCIAL HISTORY:  She is a negative drinker.  She smoked as a teen but has not smoked since.  FAMILY HISTORY:  Not significant.  REVIEW OF SYSTEMS:  She has overactive bladder, as noted.  Arthritis, cramps  in her legs and hands.  She wears eyeglasses, she has had cataracts.  She has had angina in the past, has had an angioplasty previously.  Has hypothyroidism, anemia, constipation.  PHYSICAL EXAMINATION:  VITAL SIGNS:  Blood pressure 107/55, pulse 61, respirations 14, temperature 97.1 degrees Fahrenheit.  GENERAL:  She is elderly, in no acute distress.  Oriented x 3.  HEENT:  PERRLA.  Ear, nose, and throat clear.  NECK:  Without masses or thyromegaly.  CHEST:  Clear anterior and posterior without rales or rhonchi.  ABDOMEN:  Soft, nontender, no mass or organomegaly.  EXTREMITIES:  Normal.  NEUROLOGIC:  Intact.  SKIN:  Normal.  UROLOGIC:  She has no costovertebral angle or suprapubic tenderness or palpable  masses.  LABORATORY DATA:  Radiologic evaluation was performed on CT scan.  It was closely evaluated.  She was noted to have prompt bilateral excretion of the dye. However, there was left renal atrophy noted, and what appeared to be possibly an old chronic ureteral pelvic junction obstruction.  The ureter was not dilated on that side, and no stones were seen actually in the collecting system.  She had punctate stones in the parenchyma.  On the right side, there appeared to be a moderate, most  likely chronic hydronephrosis with, again, punctate calculi in the parenchyma, and a dilated thickened ureter to the level of the iliac vessels on the right side, but no stone or obstruction was noted and, again, the changes were felt most likely to be chronic in nature.  Pertinent laboratory evaluation - BUN/creatinine 17/0.8.  IMPRESSION:  Several chronic urologic abnormalities noted - stones, left renal atrophy, bilateral hydronephrosis, right ureterectasis.  Urge incontinence noted.  PLAN:  She can be discharged from the hospital from our standpoint and I found these are chronic in nature, and she can follow up with Dr. Lorenso Courier in [place] for continued management of  these chronic changes.  He is actively following her currently.  Please call as needed. DD:  01/18/00 TD:  01/18/00 Job: [id number]

## 2011-04-12 NOTE — Cardiovascular Report (Signed)
. [facility]  Patient:    Carmen Vaughn, Carmen Vaughn Visit Number: [id number] MRN: [id number]          Service Type: MED Location: [phone] Attending Physician:  Daisey Must Dictated by:   Everardo Beals Juanda Chance, M.D. [facility] Proc. Date: 09/30/01 Admit Date:  09/29/2001   CC:         Selinda Flavin, M.D.  Lewayne Bunting, M.D. [facility]  Cardiopulmonary Laboratory   Cardiac Catheterization  PROCEDURES PERFORMED: Cardiac catheterization.  CLINICAL HISTORY: The patient is 77 years old and has a documented coronary artery disease and underwent stenting of a nondominant right coronary artery in March of 2000 and had residual 70-80% narrowing in the proximal LAD at that time. She has done fairly well since that time. She had a Cardiolite scan done in July of 2001, which showed inferolateral ischemia and good LV function. She recently developed recurrent symptoms suggestive of angina and was seen by Dr. Andee Lineman in consultation and arrangements were made for evaluation with angiography.  DESCRIPTION OF PROCEDURE: The procedure was performed via the right femoral artery using an arterial sheath and 6 French preformed coronary catheters.  A front wall arterial puncture was performed and Omnipaque contrast was used. We had a great deal of difficulty engaging the right coronary artery but finally got a fairly good injection with the internal mammary catheter. The patient tolerated the procedure well and left the laboratory in satisfactory condition.  RESULTS: The left main coronary artery: The left main coronary artery had a 30% ostial stenosis and a 30% narrowing in its distal portion.  Left anterior descending: The left anterior descending artery was heavily calcified in its proximal portion. There was a 70% ostial lesion and an 80% proximal stenosis. There was a 50% stenosis in the mid vessel after the first septal perforator and first diagonal branch.  Circumflex artery:  The circumflex artery was a dominant vessel that gave rise to a large marginal branch, two posterolateral branches and a posterior descending branch. There were some irregularities distally. There was 40% ostial narrowing.  Right coronary artery: The right coronary was a nondominant vessel that supplied only right ventricle branches. There was 90% narrowing within the stent in the proximal portion. The distal vessel was underfilled, but there appeared to be 70-80% narrowing in the proximal vessel outside the stent.  LEFT VENTRICULOGRAPHY: The left ventriculogram performed in the RAO projection showed good wall motion with no areas of hypokinesis. The estimated ejection fraction was 60%.  The aortic pressure was 129/56 with a mean of 84. The left ventricular pressure was 129 with end-diastolic pressure of 12.  CONCLUSIONS: Coronary artery disease, status post prior stenting of the ostium of a nondominant right coronary artery March 2000 with 90% in-stent stenosis in the stent and in the ostium of the right coronary artery, 30% left main narrowing, 70% ostial and 80% proximal narrowing in the left anterior descending artery, 40% ostial narrowing of a dominant circumflex artery and normal left ventricular function.  RECOMMENDATIONS: It is not totally clear whether the patients recent symptoms are ischemic. She has tight re-stenosis in the stent to the right coronary artery but this is a very small vessel and I doubt this will cause her any major long-term problem. It also would be very difficult to retreat.  The lesions in the LAD are also not favorable for intervention because of the ostial nature of the lesion, the takeoff and the calcification. It is not certain that these  are flow-limiting but they could be. We will plan to do a Cardiolite scan. If she has a high risk Cardiolite scan or recurrent symptoms that are felt to be ischemic, then we will consider her for bypass  surgery. Dictated by:   Everardo Beals Juanda Chance, M.D. [facility] Attending Physician:  Daisey Must DD:  09/30/01 TD:  10/02/01 Job: [id number]

## 2011-04-12 NOTE — Discharge Summary (Signed)
NAME:  Carmen Vaughn, Carmen Vaughn                           ACCOUNT NO.:  [id number]   MEDICAL RECORD NO.:  [id number]                   PATIENT TYPE:  INP   LOCATION:  4709                                 FACILITY:  [facility]   PHYSICIAN:  Veneda Melter, M.D.                   DATE OF BIRTH:  [birth year]   DATE OF ADMISSION:  07/14/2003  DATE OF DISCHARGE:  07/16/2003                                 DISCHARGE SUMMARY   DISCHARGE DIAGNOSES:  1. Chest pain, etiology unclear.     a. Cardiolite scan at [facility] suggestive of atrial ischemia.     b. Cardiac catheterization this admission revealing native coronary        artery disease and small vessel disease and patent LIMA to the LAD and        SVD to the first diagonal.  2. Coronary artery disease status post coronary artery bypass graft.     a. History of postoperative atrial fibrillation.  3. Hypertension.  4. Hyperlipidemia.  5. Gastroesophageal reflux disease.     a. H. pylori positive in the past.  6. Status post ERCP February 2001 for stricture of the common bile duct and     common hepatic duct - brushing showed atypia and this has been followed     on CT scan.   PROCEDURES THIS ADMISSION:  Cardiac catheterization by Dr. Andee Lineman on July 15, 2003. This revealed stable native CAD and patent grafts. The left  ventriculogram not performed.   HOSPITAL COURSE:  Please see the consult note dictated by Dr. Andee Lineman on  July 13, 2003 for complete details. Briefly, this 77 year old female with  a prior history of CAD status post CABG postoperative atrial fibrillation,  hypertension, hyperlipidemia, was seen in consultation on July 13, 2003 at  the request of Dr. Selinda Flavin. The patient presented with substernal chest  pain that seemed consistent with angina. Her symptoms were similar to  previous symptoms for which she had a cardiac catheterization performed in  September 2003. At that time her grafts were patent and medical therapy  was  continued. There had been some concern with beta blockers in the past  causing hypotension. Dr. Andee Lineman changed the patient to pindolol 5 mg twice  daily. He further evaluated her with Cardiolite testing. This was read as  positive for ischemia. The patient was transferred to [facility]  for further evaluation of her chest pain. She also complained of shortness  of breath, which is chronic in nature. At [facility] she underwent  cardiac catheterization by Dr. Andee Lineman on July 15, 2003. The results are  noted above. She tolerated the procedure well and no major complications.  Dr. Andee Lineman noted that her grafts were patent and she had stable native CAD.  He noted that she could have ischemia in the first diagonal, but the  nondominant RCA however,  there is no indication for percutaneous coronary  intervention after discussion with Dr. Loraine Leriche Pulsipher. Therefore, he  increased her medical therapy. He added pindolol and increased her Imdur to  120 mg daily. She will need follow-up with Dr. Dimas Aguas for further evaluation  for shortness of breath. We will see her in two to three weeks in the [place]  office for follow-up and also for an echocardiogram. She was stable on  July 16, 2003 and was ready for discharge to home.   LABORATORY DATA:  White count 5700, hemoglobin 11.5, hematocrit 15.9,  platelet count 144,000. Sodium 139, potassium 4.1, chloride 110, CO2 27,  glucose 114, BUN 13, creatinine 1.0, calcium 8.7. Cardiac enzymes at  [facility] were negative.   DISCHARGE MEDICATIONS:  1. Protonix 40 mg twice daily (this was increased this admission).  2. Evista 50 mg daily.  3. Detrol 4 mg daily.  4. Synthroid 0.1 mg daily.  5. Pindolol 5 mg twice daily.  6. Imdur 120 mg daily.  7. Coated aspirin 81 mg daily.  8. Nitroglycerin p.r.n. chest pain.  9. Pain management Tylenol as needed.   DISCHARGE INSTRUCTIONS:  She is to call our office in [place] or 911 for   recurrent chest pain.   ACTIVITY:  No driving, heavy, lifting, exertion, or work for three days.   DIET:  Low-fat, low-sodium.   WOUND CARE:  The patient is to call the office in [place] with any concerns,  groin swelling, bleeding, bruising.   DISCHARGE FOLLOW UP:  With Dr. Andee Lineman in two to three weeks. The office will  call her with an appointment. She will have an echocardiogram performed at  that time. She will need to follow up with Dr. Dimas Aguas in the next one to two  weeks. She should call him for an appointment. She will need further  evaluation for her shortness of breath with him. She should also follow-up  on her mild anemia with Dr. Dimas Aguas.      Tereso Newcomer, P.A.                        Veneda Melter, M.D.    SW/MEDQ  D:  07/16/2003  T:  07/16/2003  Job:  [id number]   cc:   Selinda Flavin  [address]  [place]  [place] [address]  Fax: [phone]   Learta Codding, M.D.  [phone]  [address]  [place]  [place] [address]

## 2011-04-26 ENCOUNTER — Other Ambulatory Visit: Payer: Self-pay | Admitting: Physician Assistant

## 2011-08-27 ENCOUNTER — Ambulatory Visit: Payer: Medicare Other | Admitting: Cardiology

## 2011-08-29 ENCOUNTER — Encounter: Payer: Self-pay | Admitting: Cardiology

## 2011-08-29 ENCOUNTER — Ambulatory Visit (INDEPENDENT_AMBULATORY_CARE_PROVIDER_SITE_OTHER): Payer: Medicare Other | Admitting: Cardiology

## 2011-08-29 VITALS — BP 114/66 | HR 77 | Ht 65.0 in | Wt 137.5 lb

## 2011-08-29 DIAGNOSIS — T148XXA Other injury of unspecified body region, initial encounter: Secondary | ICD-10-CM

## 2011-08-29 DIAGNOSIS — I080 Rheumatic disorders of both mitral and aortic valves: Secondary | ICD-10-CM

## 2011-08-29 DIAGNOSIS — R0989 Other specified symptoms and signs involving the circulatory and respiratory systems: Secondary | ICD-10-CM

## 2011-08-29 DIAGNOSIS — K219 Gastro-esophageal reflux disease without esophagitis: Secondary | ICD-10-CM

## 2011-08-29 DIAGNOSIS — I2510 Atherosclerotic heart disease of native coronary artery without angina pectoris: Secondary | ICD-10-CM

## 2011-08-29 MED ORDER — ASPIRIN EC 81 MG PO TBEC: 81.0000 mg | DELAYED_RELEASE_TABLET | Freq: Every day | ORAL | Status: DC

## 2011-08-29 NOTE — Assessment & Plan Note (Signed)
Responding well to Nexium. I feel it safe at this point in time to resume aspirin 81 mg a day particularly light of the patient's prior coronary bypass grafting.

## 2011-08-29 NOTE — Assessment & Plan Note (Signed)
Right carotid bruit no and moderate bilateral carotid artery disease. Resume aspirin

## 2011-08-29 NOTE — Progress Notes (Signed)
History of present illness: Patient is an 77 year old female with a history of coronary artery disease, status post coronary bypass grafting, normal LV function and moderate mitral regurgitation. She also has a history of an occluded left external carotid artery with moderate bilateral internal carotid artery disease. Earlier this year she was admitted with substernal chest pain was ruled out for myocardial infarction. She was also ruled out for pulmonary emboli. In retrospect it appeared that she had esophageal reflux disease and has responded well to Nexium. She currently reports no chest pain shortness of breath orthopnea or PND she has no palpitations or syncope. The patient is doing remarkably well for her age.   Allergies, family history and social history: As documented in chart and reviewed  Medications: Documented and reviewed in chart.  Past medical history: Reviewed and see problem list below   Review of systems: No melena hematochezia. No dysuria or frequency. No fever or chills. No orthopnea PND no palpitations or syncope. Patient has an unsteady gait and uses a cane   Physical examination : Vital signs documented below General: No distress HEENT: Normal, so bilaterally but with a right-sided carotid bruit. EOMI PERRLA. Oropharynx is clear Lungs: Lungs clear breath sounds bilaterally with no wheezing Heart: Regular rate and rhythm with normal S1-S2 no pathological murmur Abdomen: Abdomen soft nontender no rebound or guarding good bowel sounds Extremity exam: No cyanosis clubbing or edema Neurologic: Alert and oriented x3 nonfocal Psychiatric: Normal affect Vascular exam: Normal distal pulses   12-lead electrocardiogram: Normal sinus rhythm no acute changes

## 2011-08-29 NOTE — Assessment & Plan Note (Signed)
Resume aspirin 81 mg a day.

## 2011-08-29 NOTE — Assessment & Plan Note (Signed)
Ongoing back pain controlled with medical therapy.

## 2011-08-29 NOTE — Patient Instructions (Addendum)
Continue all current medications.  Your physician wants you to follow up in: 6 months.  You will receive a reminder letter in the mail one-two months in advance.  If you don't receive a letter, please call our office to schedule the follow up appointment   Restart Aspirin 81mg  daily

## 2011-08-29 NOTE — Assessment & Plan Note (Signed)
No heart failure symptoms. Continue current medical regimen

## 2011-12-04 DIAGNOSIS — R4182 Altered mental status, unspecified: Secondary | ICD-10-CM | POA: Diagnosis not present

## 2011-12-04 DIAGNOSIS — Z951 Presence of aortocoronary bypass graft: Secondary | ICD-10-CM | POA: Diagnosis not present

## 2011-12-04 DIAGNOSIS — Z79899 Other long term (current) drug therapy: Secondary | ICD-10-CM | POA: Diagnosis not present

## 2011-12-04 DIAGNOSIS — IMO0002 Reserved for concepts with insufficient information to code with codable children: Secondary | ICD-10-CM | POA: Diagnosis not present

## 2011-12-04 DIAGNOSIS — J449 Chronic obstructive pulmonary disease, unspecified: Secondary | ICD-10-CM | POA: Diagnosis not present

## 2011-12-04 DIAGNOSIS — M5430 Sciatica, unspecified side: Secondary | ICD-10-CM | POA: Diagnosis not present

## 2011-12-04 DIAGNOSIS — M545 Low back pain: Secondary | ICD-10-CM | POA: Diagnosis not present

## 2011-12-04 DIAGNOSIS — N390 Urinary tract infection, site not specified: Secondary | ICD-10-CM | POA: Diagnosis not present

## 2011-12-04 DIAGNOSIS — S22009A Unspecified fracture of unspecified thoracic vertebra, initial encounter for closed fracture: Secondary | ICD-10-CM | POA: Diagnosis not present

## 2011-12-04 DIAGNOSIS — R5383 Other fatigue: Secondary | ICD-10-CM | POA: Diagnosis not present

## 2011-12-04 DIAGNOSIS — M479 Spondylosis, unspecified: Secondary | ICD-10-CM | POA: Diagnosis not present

## 2011-12-04 DIAGNOSIS — Z7982 Long term (current) use of aspirin: Secondary | ICD-10-CM | POA: Diagnosis not present

## 2011-12-10 ENCOUNTER — Other Ambulatory Visit: Payer: Self-pay | Admitting: Family Medicine

## 2011-12-10 DIAGNOSIS — A419 Sepsis, unspecified organism: Secondary | ICD-10-CM | POA: Diagnosis not present

## 2011-12-10 DIAGNOSIS — R5381 Other malaise: Secondary | ICD-10-CM | POA: Diagnosis not present

## 2011-12-10 DIAGNOSIS — A498 Other bacterial infections of unspecified site: Secondary | ICD-10-CM | POA: Diagnosis not present

## 2011-12-10 DIAGNOSIS — Z4789 Encounter for other orthopedic aftercare: Secondary | ICD-10-CM | POA: Diagnosis not present

## 2011-12-10 DIAGNOSIS — N179 Acute kidney failure, unspecified: Secondary | ICD-10-CM | POA: Diagnosis present

## 2011-12-10 DIAGNOSIS — IMO0002 Reserved for concepts with insufficient information to code with codable children: Secondary | ICD-10-CM | POA: Diagnosis not present

## 2011-12-10 DIAGNOSIS — K5900 Constipation, unspecified: Secondary | ICD-10-CM | POA: Diagnosis not present

## 2011-12-10 DIAGNOSIS — Z87442 Personal history of urinary calculi: Secondary | ICD-10-CM | POA: Diagnosis not present

## 2011-12-10 DIAGNOSIS — M545 Low back pain: Secondary | ICD-10-CM | POA: Diagnosis not present

## 2011-12-10 DIAGNOSIS — R509 Fever, unspecified: Secondary | ICD-10-CM | POA: Diagnosis not present

## 2011-12-10 DIAGNOSIS — Z951 Presence of aortocoronary bypass graft: Secondary | ICD-10-CM | POA: Diagnosis not present

## 2011-12-10 DIAGNOSIS — Z79899 Other long term (current) drug therapy: Secondary | ICD-10-CM | POA: Diagnosis not present

## 2011-12-10 DIAGNOSIS — I2510 Atherosclerotic heart disease of native coronary artery without angina pectoris: Secondary | ICD-10-CM | POA: Diagnosis present

## 2011-12-10 DIAGNOSIS — M19049 Primary osteoarthritis, unspecified hand: Secondary | ICD-10-CM | POA: Diagnosis not present

## 2011-12-10 DIAGNOSIS — F329 Major depressive disorder, single episode, unspecified: Secondary | ICD-10-CM | POA: Diagnosis present

## 2011-12-10 DIAGNOSIS — M5126 Other intervertebral disc displacement, lumbar region: Secondary | ICD-10-CM | POA: Diagnosis not present

## 2011-12-10 DIAGNOSIS — M5430 Sciatica, unspecified side: Secondary | ICD-10-CM | POA: Diagnosis not present

## 2011-12-10 DIAGNOSIS — M19039 Primary osteoarthritis, unspecified wrist: Secondary | ICD-10-CM | POA: Diagnosis not present

## 2011-12-10 DIAGNOSIS — M810 Age-related osteoporosis without current pathological fracture: Secondary | ICD-10-CM | POA: Diagnosis not present

## 2011-12-10 DIAGNOSIS — H5000 Unspecified esotropia: Secondary | ICD-10-CM | POA: Diagnosis present

## 2011-12-10 DIAGNOSIS — E860 Dehydration: Secondary | ICD-10-CM | POA: Diagnosis present

## 2011-12-10 DIAGNOSIS — M8448XA Pathological fracture, other site, initial encounter for fracture: Secondary | ICD-10-CM | POA: Diagnosis not present

## 2011-12-10 DIAGNOSIS — M25539 Pain in unspecified wrist: Secondary | ICD-10-CM | POA: Diagnosis not present

## 2011-12-10 DIAGNOSIS — R5383 Other fatigue: Secondary | ICD-10-CM | POA: Diagnosis not present

## 2011-12-10 DIAGNOSIS — N390 Urinary tract infection, site not specified: Secondary | ICD-10-CM | POA: Diagnosis not present

## 2011-12-10 DIAGNOSIS — K219 Gastro-esophageal reflux disease without esophagitis: Secondary | ICD-10-CM | POA: Diagnosis present

## 2011-12-10 DIAGNOSIS — Z880 Allergy status to penicillin: Secondary | ICD-10-CM | POA: Diagnosis not present

## 2011-12-10 DIAGNOSIS — Z9889 Other specified postprocedural states: Secondary | ICD-10-CM | POA: Diagnosis not present

## 2011-12-10 DIAGNOSIS — Z7982 Long term (current) use of aspirin: Secondary | ICD-10-CM | POA: Diagnosis not present

## 2011-12-11 ENCOUNTER — Other Ambulatory Visit (HOSPITAL_COMMUNITY): Payer: Self-pay | Admitting: Family Medicine

## 2011-12-11 DIAGNOSIS — IMO0002 Reserved for concepts with insufficient information to code with codable children: Secondary | ICD-10-CM

## 2011-12-12 ENCOUNTER — Ambulatory Visit (HOSPITAL_COMMUNITY)
Admission: RE | Admit: 2011-12-12 | Discharge: 2011-12-12 | Disposition: A | Discharge status: Home / Self Care | Payer: PRIVATE HEALTH INSURANCE | Source: Ambulatory Visit | Attending: Family Medicine | Admitting: Family Medicine

## 2011-12-12 DIAGNOSIS — IMO0002 Reserved for concepts with insufficient information to code with codable children: Secondary | ICD-10-CM

## 2011-12-12 DIAGNOSIS — M8448XA Pathological fracture, other site, initial encounter for fracture: Secondary | ICD-10-CM | POA: Insufficient documentation

## 2011-12-12 DIAGNOSIS — M810 Age-related osteoporosis without current pathological fracture: Secondary | ICD-10-CM | POA: Insufficient documentation

## 2011-12-12 DIAGNOSIS — M545 Low back pain: Secondary | ICD-10-CM | POA: Diagnosis not present

## 2011-12-12 MED ORDER — FENTANYL CITRATE 0.05 MG/ML IJ SOLN
INTRAMUSCULAR | Status: AC
  Filled 2011-12-12: qty 4

## 2011-12-12 MED ORDER — HYDROMORPHONE HCL PF 1 MG/ML IJ SOLN
INTRAMUSCULAR | Status: AC
  Filled 2011-12-12: qty 3

## 2011-12-12 MED ORDER — MIDAZOLAM HCL 2 MG/2ML IJ SOLN
INTRAMUSCULAR | Status: AC
  Filled 2011-12-12: qty 4

## 2011-12-12 MED ORDER — HYDROMORPHONE HCL PF 1 MG/ML IJ SOLN
INTRAMUSCULAR | Status: AC
  Filled 2011-12-12: qty 1

## 2011-12-12 MED ORDER — HYDROMORPHONE HCL PF 1 MG/ML IJ SOLN
INTRAMUSCULAR | Status: AC | PRN
  Administered 2011-12-12: 1 mg

## 2011-12-12 MED ORDER — MIDAZOLAM HCL 2 MG/2ML IJ SOLN
INTRAMUSCULAR | Status: AC
  Filled 2011-12-12: qty 12

## 2011-12-12 MED ORDER — MIDAZOLAM HCL 5 MG/5ML IJ SOLN
INTRAMUSCULAR | Status: AC | PRN
  Administered 2011-12-12 (×4): 1 mg via INTRAVENOUS

## 2011-12-12 MED ORDER — DEXTROSE 5 % IV SOLN
1.0000 g | INTRAVENOUS | Status: AC
  Administered 2011-12-12: 1 g via INTRAVENOUS
  Filled 2011-12-12: qty 10

## 2011-12-12 MED ORDER — VANCOMYCIN HCL IN DEXTROSE 1-5 GM/200ML-% IV SOLN: 1000.0000 mg | Freq: Once | INTRAVENOUS | Status: DC

## 2011-12-12 MED ORDER — TOBRAMYCIN SULFATE 1.2 G IJ SOLR
INTRAMUSCULAR | Status: AC
  Filled 2011-12-12: qty 1.2

## 2011-12-12 MED ORDER — FENTANYL CITRATE 0.05 MG/ML IJ SOLN
INTRAMUSCULAR | Status: AC | PRN
  Administered 2011-12-12 (×3): 25 ug via INTRAVENOUS

## 2011-12-12 NOTE — ED Notes (Signed)
[facility] here.  VSS, tolerated procedure well.  02 2L in place repoare [employer]

## 2011-12-12 NOTE — Procedures (Signed)
S/P  KP for painful compression fracture. No acute complications  Full report to follow.

## 2011-12-12 NOTE — H&P (Signed)
Carmen Vaughn is an 78 y.o. female.   Chief Complaint: Lumbar #2 fracture HPI: scheduled for Vertebroplasty vs Kyphoplasty today Thoracic #4 vertebroplasty performed 12/25/10.  No past medical history on file.  No past surgical history on file.  No family history on file. Social History:  reports that she has been passively smoking.  She has never used smokeless tobacco. She reports that she does not drink alcohol or use illicit drugs.  Allergies:  Allergies  Allergen Reactions  . Nitroglycerin     REACTION: bp drop  . Penicillins     REACTION: rash    Medications Prior to Admission  Medication Sig Dispense Refill  . albuterol (PROVENTIL HFA;VENTOLIN HFA) 108 (90 BASE) MCG/ACT inhaler Inhale 2 puffs into the lungs every 6 (six) hours as needed.        Marland Kitchen albuterol (PROVENTIL) (2.5 MG/3ML) 0.083% nebulizer solution Take 2.5 mg by nebulization every 6 (six) hours as needed.        Marland Kitchen aspirin EC 81 MG tablet Take 1 tablet (81 mg total) by mouth daily.      . calcitonin, salmon, (MIACALCIN/FORTICAL) 200 UNIT/ACT nasal spray Place 1 spray into the nose daily.        . calcium carbonate (OS-CAL) 600 MG TABS Take 600 mg by mouth 2 (two) times daily with a meal.        . DULoxetine (CYMBALTA) 30 MG capsule Take 30 mg by mouth daily.        . Fluticasone-Salmeterol (ADVAIR) 250-50 MCG/DOSE AEPB Inhale 1 puff into the lungs every 12 (twelve) hours.        Marland Kitchen HYDROcodone-acetaminophen (VICODIN) 5-500 MG per tablet Take 1 tablet by mouth 2 (two) times daily as needed.        Marland Kitchen levothyroxine (SYNTHROID, LEVOTHROID) 112 MCG tablet Take 112 mcg by mouth daily.        . Multiple Vitamins-Minerals (CENTRUM SILVER PO) Take 1 tablet by mouth daily.        Marland Kitchen NEXIUM 40 MG capsule TAKE 1 CAPSULE BY MOUTH TWICE DAILY.  60 each  2  . raloxifene (EVISTA) 60 MG tablet Take 60 mg by mouth daily.        . vitamin B-12 (CYANOCOBALAMIN) 500 MCG tablet Take 500 mcg by mouth daily.         No current  facility-administered medications on file as of 12/12/2011.    No results found for this or any previous visit (from the past 48 hour(s)). No results found.  ROS  There were no vitals taken for this visit. Physical Exam   Assessment/Plan L2  fracture. Scheduled for KP vs VP today with Dr Corliss Skains Pt and family aware of procedure benefits and risks and agreeable to proceed. Thoracic #4 VP performed last yr. Consent signed.  [name] A 12/12/2011, 9:58 AM

## 2011-12-13 MED FILL — Morphine Sulfate Inj 10 MG/ML: INTRAMUSCULAR | Qty: 1 | Status: AC

## 2011-12-17 DIAGNOSIS — N390 Urinary tract infection, site not specified: Secondary | ICD-10-CM | POA: Diagnosis not present

## 2011-12-17 DIAGNOSIS — F329 Major depressive disorder, single episode, unspecified: Secondary | ICD-10-CM | POA: Diagnosis not present

## 2011-12-17 DIAGNOSIS — I2510 Atherosclerotic heart disease of native coronary artery without angina pectoris: Secondary | ICD-10-CM | POA: Diagnosis not present

## 2011-12-17 DIAGNOSIS — M810 Age-related osteoporosis without current pathological fracture: Secondary | ICD-10-CM | POA: Diagnosis not present

## 2011-12-17 DIAGNOSIS — M8448XD Pathological fracture, other site, subsequent encounter for fracture with routine healing: Secondary | ICD-10-CM | POA: Diagnosis not present

## 2011-12-17 DIAGNOSIS — A498 Other bacterial infections of unspecified site: Secondary | ICD-10-CM | POA: Diagnosis not present

## 2011-12-19 DIAGNOSIS — I2510 Atherosclerotic heart disease of native coronary artery without angina pectoris: Secondary | ICD-10-CM | POA: Diagnosis not present

## 2011-12-19 DIAGNOSIS — M810 Age-related osteoporosis without current pathological fracture: Secondary | ICD-10-CM | POA: Diagnosis not present

## 2011-12-19 DIAGNOSIS — N390 Urinary tract infection, site not specified: Secondary | ICD-10-CM | POA: Diagnosis not present

## 2011-12-19 DIAGNOSIS — M8448XD Pathological fracture, other site, subsequent encounter for fracture with routine healing: Secondary | ICD-10-CM | POA: Diagnosis not present

## 2011-12-19 DIAGNOSIS — A498 Other bacterial infections of unspecified site: Secondary | ICD-10-CM | POA: Diagnosis not present

## 2011-12-19 DIAGNOSIS — F329 Major depressive disorder, single episode, unspecified: Secondary | ICD-10-CM | POA: Diagnosis not present

## 2011-12-20 ENCOUNTER — Other Ambulatory Visit (HOSPITAL_COMMUNITY): Payer: Self-pay | Admitting: Interventional Radiology

## 2011-12-20 DIAGNOSIS — IMO0002 Reserved for concepts with insufficient information to code with codable children: Secondary | ICD-10-CM

## 2011-12-24 DIAGNOSIS — A498 Other bacterial infections of unspecified site: Secondary | ICD-10-CM | POA: Diagnosis not present

## 2011-12-24 DIAGNOSIS — F329 Major depressive disorder, single episode, unspecified: Secondary | ICD-10-CM | POA: Diagnosis not present

## 2011-12-24 DIAGNOSIS — N390 Urinary tract infection, site not specified: Secondary | ICD-10-CM | POA: Diagnosis not present

## 2011-12-24 DIAGNOSIS — M8448XD Pathological fracture, other site, subsequent encounter for fracture with routine healing: Secondary | ICD-10-CM | POA: Diagnosis not present

## 2011-12-24 DIAGNOSIS — M810 Age-related osteoporosis without current pathological fracture: Secondary | ICD-10-CM | POA: Diagnosis not present

## 2011-12-24 DIAGNOSIS — I2510 Atherosclerotic heart disease of native coronary artery without angina pectoris: Secondary | ICD-10-CM | POA: Diagnosis not present

## 2011-12-25 ENCOUNTER — Ambulatory Visit (HOSPITAL_COMMUNITY): Payer: Medicare Other

## 2011-12-27 DIAGNOSIS — A498 Other bacterial infections of unspecified site: Secondary | ICD-10-CM | POA: Diagnosis not present

## 2011-12-27 DIAGNOSIS — I2510 Atherosclerotic heart disease of native coronary artery without angina pectoris: Secondary | ICD-10-CM | POA: Diagnosis not present

## 2011-12-27 DIAGNOSIS — N390 Urinary tract infection, site not specified: Secondary | ICD-10-CM | POA: Diagnosis not present

## 2011-12-27 DIAGNOSIS — M8448XD Pathological fracture, other site, subsequent encounter for fracture with routine healing: Secondary | ICD-10-CM | POA: Diagnosis not present

## 2011-12-27 DIAGNOSIS — M810 Age-related osteoporosis without current pathological fracture: Secondary | ICD-10-CM | POA: Diagnosis not present

## 2011-12-27 DIAGNOSIS — F329 Major depressive disorder, single episode, unspecified: Secondary | ICD-10-CM | POA: Diagnosis not present

## 2011-12-30 ENCOUNTER — Ambulatory Visit (HOSPITAL_COMMUNITY)
Admission: RE | Admit: 2011-12-30 | Discharge: 2011-12-30 | Disposition: A | Discharge status: Home / Self Care | Payer: Medicare Other | Source: Ambulatory Visit | Attending: Interventional Radiology | Admitting: Interventional Radiology

## 2011-12-30 ENCOUNTER — Other Ambulatory Visit (HOSPITAL_COMMUNITY): Payer: Self-pay | Admitting: Interventional Radiology

## 2011-12-30 DIAGNOSIS — IMO0002 Reserved for concepts with insufficient information to code with codable children: Secondary | ICD-10-CM

## 2011-12-30 DIAGNOSIS — M51379 Other intervertebral disc degeneration, lumbosacral region without mention of lumbar back pain or lower extremity pain: Secondary | ICD-10-CM | POA: Insufficient documentation

## 2011-12-30 DIAGNOSIS — M47817 Spondylosis without myelopathy or radiculopathy, lumbosacral region: Secondary | ICD-10-CM | POA: Diagnosis not present

## 2011-12-30 DIAGNOSIS — M8448XA Pathological fracture, other site, initial encounter for fracture: Secondary | ICD-10-CM | POA: Diagnosis not present

## 2011-12-30 DIAGNOSIS — M5137 Other intervertebral disc degeneration, lumbosacral region: Secondary | ICD-10-CM | POA: Insufficient documentation

## 2012-01-02 ENCOUNTER — Other Ambulatory Visit (HOSPITAL_COMMUNITY): Payer: Self-pay | Admitting: Interventional Radiology

## 2012-01-02 DIAGNOSIS — IMO0002 Reserved for concepts with insufficient information to code with codable children: Secondary | ICD-10-CM

## 2012-01-02 DIAGNOSIS — Z09 Encounter for follow-up examination after completed treatment for conditions other than malignant neoplasm: Secondary | ICD-10-CM

## 2012-01-04 DIAGNOSIS — F329 Major depressive disorder, single episode, unspecified: Secondary | ICD-10-CM | POA: Diagnosis not present

## 2012-01-04 DIAGNOSIS — M810 Age-related osteoporosis without current pathological fracture: Secondary | ICD-10-CM | POA: Diagnosis not present

## 2012-01-04 DIAGNOSIS — A498 Other bacterial infections of unspecified site: Secondary | ICD-10-CM | POA: Diagnosis not present

## 2012-01-04 DIAGNOSIS — M8448XD Pathological fracture, other site, subsequent encounter for fracture with routine healing: Secondary | ICD-10-CM | POA: Diagnosis not present

## 2012-01-04 DIAGNOSIS — I2510 Atherosclerotic heart disease of native coronary artery without angina pectoris: Secondary | ICD-10-CM | POA: Diagnosis not present

## 2012-01-04 DIAGNOSIS — N390 Urinary tract infection, site not specified: Secondary | ICD-10-CM | POA: Diagnosis not present

## 2012-01-13 ENCOUNTER — Ambulatory Visit (HOSPITAL_COMMUNITY): Payer: Medicare Other

## 2012-01-14 DIAGNOSIS — J438 Other emphysema: Secondary | ICD-10-CM | POA: Diagnosis not present

## 2012-01-14 DIAGNOSIS — E039 Hypothyroidism, unspecified: Secondary | ICD-10-CM | POA: Diagnosis not present

## 2012-01-14 DIAGNOSIS — M129 Arthropathy, unspecified: Secondary | ICD-10-CM | POA: Diagnosis not present

## 2012-01-14 DIAGNOSIS — R634 Abnormal weight loss: Secondary | ICD-10-CM | POA: Diagnosis not present

## 2012-02-12 DIAGNOSIS — J45901 Unspecified asthma with (acute) exacerbation: Secondary | ICD-10-CM | POA: Diagnosis not present

## 2012-02-27 DIAGNOSIS — R634 Abnormal weight loss: Secondary | ICD-10-CM | POA: Diagnosis not present

## 2012-02-27 DIAGNOSIS — M129 Arthropathy, unspecified: Secondary | ICD-10-CM | POA: Diagnosis not present

## 2012-02-27 DIAGNOSIS — J438 Other emphysema: Secondary | ICD-10-CM | POA: Diagnosis not present

## 2012-02-27 DIAGNOSIS — E039 Hypothyroidism, unspecified: Secondary | ICD-10-CM | POA: Diagnosis not present

## 2012-04-30 DIAGNOSIS — E039 Hypothyroidism, unspecified: Secondary | ICD-10-CM | POA: Diagnosis not present

## 2012-04-30 DIAGNOSIS — R300 Dysuria: Secondary | ICD-10-CM | POA: Diagnosis not present

## 2012-04-30 DIAGNOSIS — J45901 Unspecified asthma with (acute) exacerbation: Secondary | ICD-10-CM | POA: Diagnosis not present

## 2012-04-30 DIAGNOSIS — R634 Abnormal weight loss: Secondary | ICD-10-CM | POA: Diagnosis not present

## 2012-04-30 DIAGNOSIS — E538 Deficiency of other specified B group vitamins: Secondary | ICD-10-CM | POA: Diagnosis not present

## 2012-04-30 DIAGNOSIS — M129 Arthropathy, unspecified: Secondary | ICD-10-CM | POA: Diagnosis not present

## 2012-04-30 DIAGNOSIS — R079 Chest pain, unspecified: Secondary | ICD-10-CM | POA: Diagnosis not present

## 2012-06-05 DIAGNOSIS — N3090 Cystitis, unspecified without hematuria: Secondary | ICD-10-CM | POA: Diagnosis not present

## 2012-06-05 DIAGNOSIS — R300 Dysuria: Secondary | ICD-10-CM | POA: Diagnosis not present

## 2012-06-24 ENCOUNTER — Ambulatory Visit (INDEPENDENT_AMBULATORY_CARE_PROVIDER_SITE_OTHER): Payer: Medicare Other | Admitting: Physician Assistant

## 2012-06-24 ENCOUNTER — Encounter: Payer: Self-pay | Admitting: *Deleted

## 2012-06-24 ENCOUNTER — Telehealth: Payer: Self-pay

## 2012-06-24 ENCOUNTER — Encounter: Payer: Self-pay | Admitting: Physician Assistant

## 2012-06-24 ENCOUNTER — Other Ambulatory Visit: Payer: Self-pay | Admitting: Physician Assistant

## 2012-06-24 VITALS — BP 149/67 | HR 87 | Ht 65.0 in | Wt 138.1 lb

## 2012-06-24 DIAGNOSIS — I4949 Other premature depolarization: Secondary | ICD-10-CM | POA: Diagnosis not present

## 2012-06-24 DIAGNOSIS — I493 Ventricular premature depolarization: Secondary | ICD-10-CM | POA: Insufficient documentation

## 2012-06-24 DIAGNOSIS — I25810 Atherosclerosis of coronary artery bypass graft(s) without angina pectoris: Secondary | ICD-10-CM | POA: Diagnosis not present

## 2012-06-24 DIAGNOSIS — I080 Rheumatic disorders of both mitral and aortic valves: Secondary | ICD-10-CM

## 2012-06-24 DIAGNOSIS — R0989 Other specified symptoms and signs involving the circulatory and respiratory systems: Secondary | ICD-10-CM

## 2012-06-24 DIAGNOSIS — R0602 Shortness of breath: Secondary | ICD-10-CM

## 2012-06-24 DIAGNOSIS — E785 Hyperlipidemia, unspecified: Secondary | ICD-10-CM

## 2012-06-24 DIAGNOSIS — R5383 Other fatigue: Secondary | ICD-10-CM

## 2012-06-24 DIAGNOSIS — R072 Precordial pain: Secondary | ICD-10-CM

## 2012-06-24 MED ORDER — ASPIRIN EC 81 MG PO TBEC: 81.0000 mg | DELAYED_RELEASE_TABLET | Freq: Every day | ORAL | Status: AC

## 2012-06-24 MED ORDER — METOPROLOL SUCCINATE ER 25 MG PO TB24: 25.0000 mg | ORAL_TABLET | Freq: Every day | ORAL | Status: DC

## 2012-06-24 NOTE — Assessment & Plan Note (Signed)
Patient reportedly intolerant to multiple statins. We'll reassess lipid status, and consider adding omega-3 fish oil, pending review of results.

## 2012-06-24 NOTE — Telephone Encounter (Signed)
No precert required.  Medicare & [employer]

## 2012-06-24 NOTE — Assessment & Plan Note (Signed)
Continue conservative management with medical therapy, as previously recommended. Patient to resume low-dose ASA.

## 2012-06-24 NOTE — Patient Instructions (Addendum)
   Aspirin 81mg  daily  Labs:  Today - CMET, CBC, TSH, FLP  Echo  Lexiscan Myoview (stress test)  Office will contact with results  Toprol XL 25mg  daily Follow up in  1 month

## 2012-06-24 NOTE — Assessment & Plan Note (Signed)
Isolated PVC noted on EKG today. Patient has history of normal LVF. Low-dose Toprol to be started today.

## 2012-06-24 NOTE — Progress Notes (Signed)
Primary Cardiologist: Lewayne Bunting, MD   HPI: Patient presents for six-month followup.  Since last OV with Dr. Andee Lineman, 10/12, patient suggests progressive exertional CP and DOE. She denies symptoms at rest. Chest discomfort relieved promptly with rest. Unable to take any NTG, secondary to history of severe hypotension, per her report. Of note, she is currently not on ASA. Intolerance to numerous statins.  EKG in office today, reviewed by me, indicated NSR with isolated PVC, and no definite ischemic changes  Allergies  Allergen Reactions  . Nitroglycerin     REACTION: bp drop  . Penicillins     REACTION: rash    Current Outpatient Prescriptions  Medication Sig Dispense Refill  . Acetaminophen (TYLENOL PO) Take 500 mg by mouth as needed.      Marland Kitchen albuterol (PROVENTIL HFA;VENTOLIN HFA) 108 (90 BASE) MCG/ACT inhaler Inhale 2 puffs into the lungs every 6 (six) hours as needed.        Marland Kitchen albuterol (PROVENTIL) (2.5 MG/3ML) 0.083% nebulizer solution Take 2.5 mg by nebulization every 6 (six) hours as needed.        . Biotin 2500 MCG CAPS Take by mouth.      . calcium carbonate (OS-CAL) 600 MG TABS Take 600 mg by mouth 2 (two) times daily with a meal.        . Cholecalciferol (VITAMIN D3) 2000 UNITS TABS Take by mouth daily.      . Fluticasone-Salmeterol (ADVAIR) 250-50 MCG/DOSE AEPB Inhale 1 puff into the lungs every 12 (twelve) hours.        Marland Kitchen HYDROcodone-acetaminophen (VICODIN) 5-500 MG per tablet Take 1 tablet by mouth 2 (two) times daily as needed.        Marland Kitchen levothyroxine (SYNTHROID, LEVOTHROID) 112 MCG tablet Take 112 mcg by mouth daily.        . Multiple Vitamins-Minerals (CENTRUM SILVER PO) Take 1 tablet by mouth daily.        Marland Kitchen NEXIUM 40 MG capsule TAKE 1 CAPSULE BY MOUTH TWICE DAILY.  60 each  2  . OxyCODONE HCl, Abuse Deter, 5 MG TABS Take 5 mg by mouth as needed.      . raloxifene (EVISTA) 60 MG tablet Take 60 mg by mouth daily.        . vitamin B-12 (CYANOCOBALAMIN) 500 MCG tablet Take  500 mcg by mouth daily.          No past medical history on file.  No past surgical history on file.  History   Social History  . Marital Status: Widowed    Spouse Name: N/A    Number of Children: N/A  . Years of Education: N/A   Occupational History  . Not on file.   Social History Main Topics  . Smoking status: Passive Smoker  . Smokeless tobacco: Never Used   Comment: Husband smoked  . Alcohol Use: No  . Drug Use: No  . Sexually Active: Not on file   Other Topics Concern  . Not on file   Social History Narrative  . No narrative on file    No family history on file.  ROS: no nausea, vomiting; no fever, chills; no melena, hematochezia; no claudication  PHYSICAL EXAM: BP 149/67  Pulse 87  Ht 5\' 5"  (1.651 m)  Wt 138 lb 1.9 oz (62.651 kg)  BMI 22.98 kg/m2 GENERAL: 78 year-old female, sitting upright; NAD HEENT: NCAT, PERRLA, EOMI; sclera clear; no xanthelasma NECK: palpable bilateral carotid pulses, high-pitched right-sided bruit; no JVD; no TM LUNGS:  CTA bilaterally CARDIAC: RRR (S1, S2); no significant murmurs; no rubs or gallops ABDOMEN: soft, non-tender; intact BS EXTREMETIES: intact distal pulses; no significant peripheral edema SKIN: warm/dry; no obvious rash/lesions MUSCULOSKELETAL: no joint deformity NEURO: no focal deficit; NL affect   EKG: reviewed and available in Electronic Records   ASSESSMENT & PLAN:  CAD, ARTERY BYPASS GRAFT We'll order a Lexiscan Myoview to rule out high risk anatomy. Her last ischemic evaluation was a normal Cardiolite; EF 56%, 10/09. We'll also order a 2-D echo for reassessment of LVF, as well as severity of MR. Last study, 10/11, yielded EF 60-65%, with normal wall motion, and moderate MR and TR. Regarding medications, patient is to resume low-dose ASA, as had been previously recommended. We'll also start Toprol-XL 25 mg daily, for antianginal benefit. Of note, unable to add nitrates, given her reported history of  associated hypotension. Will schedule early return followup with Dr. Andee Lineman for review of study results, and continued close monitoring. Will also order complete blood work, including CBC, TSH, and FLP.  CAROTID BRUIT, RIGHT Continue conservative management with medical therapy, as previously recommended. Patient to resume low-dose ASA.  Ventricular ectopy Isolated PVC noted on EKG today. Patient has history of normal LVF. Low-dose Toprol to be started today.  HYPERLIPIDEMIA-MIXED Patient reportedly intolerant to multiple statins. We'll reassess lipid status, and consider adding omega-3 fish oil, pending review of results.  MITRAL REGURGITATION Previously assessed as moderate, by 2-D echo 2011. Followup study will be ordered today.    Gene Catriona Dillenbeck, PAC

## 2012-06-24 NOTE — Assessment & Plan Note (Signed)
Previously assessed as moderate, by 2-D echo 2011. Followup study will be ordered today.

## 2012-06-24 NOTE — Telephone Encounter (Signed)
lexiscan and echo complete   Monday June 29, 2012 [facility] Checking percert

## 2012-06-24 NOTE — Assessment & Plan Note (Signed)
We'll order a Lexiscan Myoview to rule out high risk anatomy. Her last ischemic evaluation was a normal Cardiolite; EF 56%, 10/09. We'll also order a 2-D echo for reassessment of LVF, as well as severity of MR. Last study, 10/11, yielded EF 60-65%, with normal wall motion, and moderate MR and TR. Regarding medications, patient is to resume low-dose ASA, as had been previously recommended. We'll also start Toprol-XL 25 mg daily, for antianginal benefit. Of note, unable to add nitrates, given her reported history of associated hypotension. Will schedule early return followup with Dr. Andee Lineman for review of study results, and continued close monitoring. Will also order complete blood work, including CBC, TSH, and FLP.

## 2012-06-29 DIAGNOSIS — I2510 Atherosclerotic heart disease of native coronary artery without angina pectoris: Secondary | ICD-10-CM

## 2012-06-29 DIAGNOSIS — R0602 Shortness of breath: Secondary | ICD-10-CM | POA: Diagnosis not present

## 2012-06-29 DIAGNOSIS — I079 Rheumatic tricuspid valve disease, unspecified: Secondary | ICD-10-CM | POA: Diagnosis not present

## 2012-06-29 DIAGNOSIS — I77819 Aortic ectasia, unspecified site: Secondary | ICD-10-CM | POA: Diagnosis not present

## 2012-06-29 DIAGNOSIS — I379 Nonrheumatic pulmonary valve disorder, unspecified: Secondary | ICD-10-CM | POA: Diagnosis not present

## 2012-06-29 DIAGNOSIS — I25810 Atherosclerosis of coronary artery bypass graft(s) without angina pectoris: Secondary | ICD-10-CM | POA: Diagnosis not present

## 2012-06-29 DIAGNOSIS — R072 Precordial pain: Secondary | ICD-10-CM | POA: Diagnosis not present

## 2012-06-29 DIAGNOSIS — I080 Rheumatic disorders of both mitral and aortic valves: Secondary | ICD-10-CM | POA: Diagnosis not present

## 2012-07-09 DIAGNOSIS — Z961 Presence of intraocular lens: Secondary | ICD-10-CM | POA: Diagnosis not present

## 2012-07-13 ENCOUNTER — Telehealth: Payer: Self-pay | Admitting: *Deleted

## 2012-07-13 NOTE — Telephone Encounter (Signed)
Notes Recorded by Lesle Chris, LPN on 1/61/0960 at 11:27 AM Patient notified and verbalized understanding. Already has follow up scheduled for 07/23/2012.

## 2012-07-13 NOTE — Telephone Encounter (Signed)
Message copied by Lesle Chris on Mon Jul 13, 2012 11:27 AM ------      Message from: Rande Brunt      Created: Fri Jul 03, 2012 10:53 AM       NL stress test; EF 58%. No further w/u

## 2012-07-20 DIAGNOSIS — J45901 Unspecified asthma with (acute) exacerbation: Secondary | ICD-10-CM | POA: Diagnosis not present

## 2012-07-20 DIAGNOSIS — R634 Abnormal weight loss: Secondary | ICD-10-CM | POA: Diagnosis not present

## 2012-07-20 DIAGNOSIS — R079 Chest pain, unspecified: Secondary | ICD-10-CM | POA: Diagnosis not present

## 2012-07-20 DIAGNOSIS — E039 Hypothyroidism, unspecified: Secondary | ICD-10-CM | POA: Diagnosis not present

## 2012-07-23 ENCOUNTER — Encounter: Payer: Self-pay | Admitting: Physician Assistant

## 2012-07-23 ENCOUNTER — Ambulatory Visit (INDEPENDENT_AMBULATORY_CARE_PROVIDER_SITE_OTHER): Payer: Medicare Other | Admitting: Physician Assistant

## 2012-07-23 VITALS — BP 108/62 | HR 67 | Ht 65.0 in | Wt 141.0 lb

## 2012-07-23 DIAGNOSIS — I25810 Atherosclerosis of coronary artery bypass graft(s) without angina pectoris: Secondary | ICD-10-CM

## 2012-07-23 DIAGNOSIS — I10 Essential (primary) hypertension: Secondary | ICD-10-CM

## 2012-07-23 DIAGNOSIS — I080 Rheumatic disorders of both mitral and aortic valves: Secondary | ICD-10-CM | POA: Diagnosis not present

## 2012-07-23 NOTE — Patient Instructions (Signed)
Continue all current medications. Your physician wants you to follow up in: 6 months.  You will receive a reminder letter in the mail one-two months in advance.  If you don't receive a letter, please call our office to schedule the follow up appointment   

## 2012-07-23 NOTE — Assessment & Plan Note (Signed)
Stable by recent followup echocardiogram suggesting moderate MR. We'll continue to monitor clinically. Conservative management is recommended, given her advanced age.

## 2012-07-23 NOTE — Assessment & Plan Note (Signed)
Continue current medication regimen, which includes low-dose ASA and Toprol, both recently added. Recent stress test yielded normal perfusion. LV function is normal, with some diastolic dysfunction. We'll plan on return visit with Dr. Andee Lineman in 6 months, or sooner if her clinical status changes.

## 2012-07-23 NOTE — Assessment & Plan Note (Signed)
Stable on current medication regimen, including recent addition of low-dose Toprol.

## 2012-07-23 NOTE — Progress Notes (Signed)
Primary Cardiologist: Lewayne Bunting, MD   HPI: Patient presents for early scheduled followup.  When last seen, I referred her for a 2-D echo and a stress Myoview, for further evaluation of progressive exertional CP and DOE. Her previous ischemic evaluation was a normal Cardiolite; EF 56%, in 2009.   2-D echo: EF 55-60%, diastolic dysfunction, normal RVF, mild AS/PR, mild aortic root dilatation, and at least moderate MR   Lexiscan Myoview: Normal; EF 58%  Clinically, she denies any interim development of exertional CP, or worsening of her baseline and DOE. She has noted some easy bruisability, since I placed her back on low-dose ASA. However, she denies any overt bleeding. She denies presyncope/syncope, palpitations, or falls.  Allergies  Allergen Reactions  . Nitroglycerin     REACTION: bp drop  . Penicillins     REACTION: rash  . Statins   . Aleve (Naproxen) Rash    Current Outpatient Prescriptions  Medication Sig Dispense Refill  . Acetaminophen (TYLENOL PO) Take 500 mg by mouth as needed.      Marland Kitchen albuterol (PROVENTIL HFA;VENTOLIN HFA) 108 (90 BASE) MCG/ACT inhaler Inhale 2 puffs into the lungs every 6 (six) hours as needed.        Marland Kitchen albuterol (PROVENTIL) (2.5 MG/3ML) 0.083% nebulizer solution Take 2.5 mg by nebulization every 6 (six) hours as needed.        Marland Kitchen aspirin EC 81 MG tablet Take 1 tablet (81 mg total) by mouth daily.      . Biotin 2500 MCG CAPS Take by mouth.      . calcium carbonate (OS-CAL) 600 MG TABS Take 600 mg by mouth 2 (two) times daily with a meal.        . Cholecalciferol (VITAMIN D3) 2000 UNITS TABS Take by mouth daily.      . Fluticasone-Salmeterol (ADVAIR) 250-50 MCG/DOSE AEPB Inhale 1 puff into the lungs every 12 (twelve) hours.        Marland Kitchen HYDROcodone-acetaminophen (VICODIN) 5-500 MG per tablet Take 1 tablet by mouth 3 (three) times daily.      Marland Kitchen levothyroxine (SYNTHROID, LEVOTHROID) 112 MCG tablet Take 112 mcg by mouth daily.        . metoprolol succinate  (TOPROL-XL) 25 MG 24 hr tablet Take 1 tablet (25 mg total) by mouth daily.  30 tablet  6  . Multiple Vitamins-Minerals (CENTRUM SILVER PO) Take 1 tablet by mouth daily.        Marland Kitchen NEXIUM 40 MG capsule TAKE 1 CAPSULE BY MOUTH TWICE DAILY.  60 each  2  . raloxifene (EVISTA) 60 MG tablet Take 60 mg by mouth daily.        . vitamin B-12 (CYANOCOBALAMIN) 500 MCG tablet Take 500 mcg by mouth daily.          No past medical history on file.  No past surgical history on file.  History   Social History  . Marital Status: Widowed    Spouse Name: N/A    Number of Children: N/A  . Years of Education: N/A   Occupational History  . Not on file.   Social History Main Topics  . Smoking status: Passive Smoker  . Smokeless tobacco: Never Used   Comment: Husband smoked  . Alcohol Use: No  . Drug Use: No  . Sexually Active: Not on file   Other Topics Concern  . Not on file   Social History Narrative  . No narrative on file    No family history on file.  ROS: no nausea, vomiting; no fever, chills; no melena, hematochezia; no claudication  PHYSICAL EXAM: BP 108/62  Pulse 67  Ht 5\' 5"  (1.651 m)  Wt 141 lb (63.957 kg)  BMI 23.46 kg/m2 GENERAL: 78 year-old female, sitting upright; NAD  HEENT: NCAT, PERRLA, EOMI; sclera clear; no xanthelasma  NECK: palpable bilateral carotid pulses, high-pitched right-sided bruit; no JVD; no TM  LUNGS: Diminished breath sounds, no crackles or wheezes CARDIAC: RRR (S1, S2); no significant murmurs; no rubs or gallops  ABDOMEN: soft, non-tender; intact BS  EXTREMETIES: trace peripheral edema  SKIN: warm/dry; no obvious rash/lesions  MUSCULOSKELETAL: no joint deformity  NEURO: no focal deficit; NL affect    EKG:    ASSESSMENT & PLAN:  CAD, ARTERY BYPASS GRAFT Continue current medication regimen, which includes low-dose ASA and Toprol, both recently added. Recent stress test yielded normal perfusion. LV function is normal, with some diastolic  dysfunction. We'll plan on return visit with Dr. Andee Lineman in 6 months, or sooner if her clinical status changes.  MITRAL REGURGITATION Stable by recent followup echocardiogram suggesting moderate MR. We'll continue to monitor clinically. Conservative management is recommended, given her advanced age.  HYPERTENSION, UNSPECIFIED Stable on current medication regimen, including recent addition of low-dose Toprol.    Gene Angelia Hazell, PAC

## 2012-07-24 ENCOUNTER — Ambulatory Visit: Payer: Medicare Other | Admitting: Physician Assistant

## 2012-10-06 DIAGNOSIS — M5430 Sciatica, unspecified side: Secondary | ICD-10-CM | POA: Diagnosis not present

## 2012-10-06 DIAGNOSIS — R5382 Chronic fatigue, unspecified: Secondary | ICD-10-CM | POA: Diagnosis not present

## 2012-10-06 DIAGNOSIS — E538 Deficiency of other specified B group vitamins: Secondary | ICD-10-CM | POA: Diagnosis not present

## 2012-10-06 DIAGNOSIS — Z79899 Other long term (current) drug therapy: Secondary | ICD-10-CM | POA: Diagnosis not present

## 2012-10-06 DIAGNOSIS — E559 Vitamin D deficiency, unspecified: Secondary | ICD-10-CM | POA: Diagnosis not present

## 2012-10-12 DIAGNOSIS — M545 Low back pain: Secondary | ICD-10-CM | POA: Diagnosis not present

## 2012-10-12 DIAGNOSIS — M5430 Sciatica, unspecified side: Secondary | ICD-10-CM | POA: Diagnosis not present

## 2012-10-12 DIAGNOSIS — M129 Arthropathy, unspecified: Secondary | ICD-10-CM | POA: Diagnosis not present

## 2012-10-12 DIAGNOSIS — IMO0001 Reserved for inherently not codable concepts without codable children: Secondary | ICD-10-CM | POA: Diagnosis not present

## 2012-10-12 DIAGNOSIS — M1990 Unspecified osteoarthritis, unspecified site: Secondary | ICD-10-CM | POA: Diagnosis not present

## 2012-10-14 DIAGNOSIS — M5430 Sciatica, unspecified side: Secondary | ICD-10-CM | POA: Diagnosis not present

## 2012-10-14 DIAGNOSIS — M1990 Unspecified osteoarthritis, unspecified site: Secondary | ICD-10-CM | POA: Diagnosis not present

## 2012-10-14 DIAGNOSIS — M129 Arthropathy, unspecified: Secondary | ICD-10-CM | POA: Diagnosis not present

## 2012-10-14 DIAGNOSIS — IMO0001 Reserved for inherently not codable concepts without codable children: Secondary | ICD-10-CM | POA: Diagnosis not present

## 2012-10-14 DIAGNOSIS — M545 Low back pain: Secondary | ICD-10-CM | POA: Diagnosis not present

## 2012-10-15 DIAGNOSIS — Z23 Encounter for immunization: Secondary | ICD-10-CM | POA: Diagnosis not present

## 2012-10-23 DIAGNOSIS — E039 Hypothyroidism, unspecified: Secondary | ICD-10-CM | POA: Diagnosis not present

## 2012-10-23 DIAGNOSIS — M5430 Sciatica, unspecified side: Secondary | ICD-10-CM | POA: Diagnosis not present

## 2012-10-23 DIAGNOSIS — M129 Arthropathy, unspecified: Secondary | ICD-10-CM | POA: Diagnosis not present

## 2012-10-23 DIAGNOSIS — J438 Other emphysema: Secondary | ICD-10-CM | POA: Diagnosis not present

## 2012-10-26 DIAGNOSIS — M545 Low back pain: Secondary | ICD-10-CM | POA: Diagnosis not present

## 2012-10-26 DIAGNOSIS — IMO0001 Reserved for inherently not codable concepts without codable children: Secondary | ICD-10-CM | POA: Diagnosis not present

## 2012-10-28 DIAGNOSIS — IMO0001 Reserved for inherently not codable concepts without codable children: Secondary | ICD-10-CM | POA: Diagnosis not present

## 2012-10-28 DIAGNOSIS — M545 Low back pain: Secondary | ICD-10-CM | POA: Diagnosis not present

## 2012-11-04 DIAGNOSIS — IMO0001 Reserved for inherently not codable concepts without codable children: Secondary | ICD-10-CM | POA: Diagnosis not present

## 2012-11-04 DIAGNOSIS — M545 Low back pain: Secondary | ICD-10-CM | POA: Diagnosis not present

## 2012-11-10 DIAGNOSIS — IMO0001 Reserved for inherently not codable concepts without codable children: Secondary | ICD-10-CM | POA: Diagnosis not present

## 2012-11-10 DIAGNOSIS — M545 Low back pain: Secondary | ICD-10-CM | POA: Diagnosis not present

## 2012-11-12 DIAGNOSIS — M545 Low back pain: Secondary | ICD-10-CM | POA: Diagnosis not present

## 2012-11-12 DIAGNOSIS — IMO0001 Reserved for inherently not codable concepts without codable children: Secondary | ICD-10-CM | POA: Diagnosis not present

## 2012-11-16 DIAGNOSIS — M545 Low back pain: Secondary | ICD-10-CM | POA: Diagnosis not present

## 2012-11-16 DIAGNOSIS — IMO0001 Reserved for inherently not codable concepts without codable children: Secondary | ICD-10-CM | POA: Diagnosis not present

## 2012-11-19 DIAGNOSIS — IMO0001 Reserved for inherently not codable concepts without codable children: Secondary | ICD-10-CM | POA: Diagnosis not present

## 2012-11-19 DIAGNOSIS — M545 Low back pain: Secondary | ICD-10-CM | POA: Diagnosis not present

## 2012-11-23 DIAGNOSIS — IMO0001 Reserved for inherently not codable concepts without codable children: Secondary | ICD-10-CM | POA: Diagnosis not present

## 2012-11-23 DIAGNOSIS — M545 Low back pain: Secondary | ICD-10-CM | POA: Diagnosis not present

## 2013-03-02 DIAGNOSIS — J438 Other emphysema: Secondary | ICD-10-CM | POA: Diagnosis not present

## 2013-03-02 DIAGNOSIS — J45901 Unspecified asthma with (acute) exacerbation: Secondary | ICD-10-CM | POA: Diagnosis not present

## 2013-03-11 ENCOUNTER — Ambulatory Visit (INDEPENDENT_AMBULATORY_CARE_PROVIDER_SITE_OTHER): Payer: Medicare Other | Admitting: Physician Assistant

## 2013-03-11 ENCOUNTER — Encounter: Payer: Self-pay | Admitting: Physician Assistant

## 2013-03-11 VITALS — BP 125/72 | HR 65 | Ht 65.0 in | Wt 133.0 lb

## 2013-03-11 DIAGNOSIS — E785 Hyperlipidemia, unspecified: Secondary | ICD-10-CM

## 2013-03-11 DIAGNOSIS — I10 Essential (primary) hypertension: Secondary | ICD-10-CM

## 2013-03-11 DIAGNOSIS — R0989 Other specified symptoms and signs involving the circulatory and respiratory systems: Secondary | ICD-10-CM | POA: Diagnosis not present

## 2013-03-11 DIAGNOSIS — I080 Rheumatic disorders of both mitral and aortic valves: Secondary | ICD-10-CM

## 2013-03-11 DIAGNOSIS — I25810 Atherosclerosis of coronary artery bypass graft(s) without angina pectoris: Secondary | ICD-10-CM

## 2013-03-11 DIAGNOSIS — I2510 Atherosclerotic heart disease of native coronary artery without angina pectoris: Secondary | ICD-10-CM | POA: Diagnosis not present

## 2013-03-11 NOTE — Progress Notes (Signed)
Primary Cardiologist: Simona Huh, MD (new)   HPI: Scheduled six-month followup, formerly patient of Dr. Andee Lineman.  She returns with no interim development of exertional CP or worsening dyspnea. Of note, however, she does have occasional brief, sharp CP, lasting only a few seconds in duration. She states she has had this since undergoing CABG, and denies any exacerbation. She also denies any symptoms suggestive of heart failure. She has been recently plagued by nonproductive cough and "congestion".   Patient has lost 8 pounds since last OV. She is aware of this, and discussed it with Dr. Dimas Aguas. She feels that she has developed a diminished appetite.  Allergies  Allergen Reactions  . Nitroglycerin     REACTION: bp drop  . Penicillins     REACTION: rash  . Statins   . Aleve (Naproxen) Rash    Current Outpatient Prescriptions  Medication Sig Dispense Refill  . Acetaminophen (TYLENOL PO) Take 500 mg by mouth as needed.      Marland Kitchen albuterol (PROVENTIL HFA;VENTOLIN HFA) 108 (90 BASE) MCG/ACT inhaler Inhale 2 puffs into the lungs every 6 (six) hours as needed.        Marland Kitchen albuterol (PROVENTIL) (2.5 MG/3ML) 0.083% nebulizer solution Take 2.5 mg by nebulization every 6 (six) hours as needed.        Marland Kitchen aspirin EC 81 MG tablet Take 1 tablet (81 mg total) by mouth daily.      . Biotin 1000 MCG tablet Take 2,000 mcg by mouth daily.      . Calcium Carb-Cholecalciferol (CALCIUM + D3) 600-200 MG-UNIT TABS Take 1 tablet by mouth daily.      Marland Kitchen HYDROcodone-acetaminophen (VICODIN) 5-500 MG per tablet Take 1 tablet by mouth 3 (three) times daily.      Marland Kitchen levothyroxine (SYNTHROID, LEVOTHROID) 112 MCG tablet Take 112 mcg by mouth daily.        . magnesium gluconate (MAGONATE) 500 MG tablet Take 500 mg by mouth daily.      . Melatonin 5 MG CAPS Take 5 mg by mouth daily.      . metoprolol succinate (TOPROL-XL) 25 MG 24 hr tablet Take 1 tablet (25 mg total) by mouth daily.  30 tablet  6  . Multiple Vitamins-Minerals  (CENTRUM SILVER PO) Take 1 tablet by mouth daily.        Marland Kitchen NEXIUM 40 MG capsule TAKE 1 CAPSULE BY MOUTH TWICE DAILY.  60 each  2  . Potassium 99 MG TABS Take 99 mg by mouth daily.      . vitamin B-12 (CYANOCOBALAMIN) 500 MCG tablet Take 500 mcg by mouth daily.        . VOLTAREN 1 % GEL Apply 2 g topically 2 (two) times daily.       . raloxifene (EVISTA) 60 MG tablet Take 60 mg by mouth daily.         No current facility-administered medications for this visit.    Past Medical History  Diagnosis Date  . CAD (coronary artery disease) of artery bypass graft     NL Lexiscan CL; EF 58%, 06/2012; 3 vessel CABG, 2001  . HTN (hypertension)   . HLD (hyperlipidemia)     Statin intolerant  . Carotid bruit   . Mitral regurgitation     Moderate, echo, 06/2012  . Aortic stenosis     Mild, echo, 06/2012  . Aortic root dilatation     Low, echo, 06/2012  . Hypothyroidism   . GERD (gastroesophageal reflux disease)  No past surgical history on file.  History   Social History  . Marital Status: Widowed    Spouse Name: N/A    Number of Children: N/A  . Years of Education: N/A   Occupational History  . Not on file.   Social History Main Topics  . Smoking status: Passive Smoke Exposure - Never Smoker  . Smokeless tobacco: Never Used     Comment: Husband smoked  . Alcohol Use: No  . Drug Use: No  . Sexually Active: Not on file   Other Topics Concern  . Not on file   Social History Narrative  . No narrative on file    No family history on file.  ROS: no nausea, vomiting; no fever, chills; no melena, hematochezia; no claudication  PHYSICAL EXAM: BP 125/72  Pulse 65  Ht 5\' 5"  (1.651 m)  Wt 133 lb (60.328 kg)  BMI 22.13 kg/m2 GENERAL: 79 year-old female, sitting upright; NAD  HEENT: NCAT, PERRLA, EOMI; sclera clear; no xanthelasma  NECK: palpable bilateral carotid pulses, high-pitched right-sided bruit; no JVD; no TM  LUNGS: Diminished breath sounds, no crackles or wheezes   CARDIAC: RRR (S1, S2); soft, grade 2/6 holosystolic murmur at base; no rubs or gallops  ABDOMEN: soft, non-tender; intact BS  EXTREMETIES: No significant peripheral edema  SKIN: warm/dry; no obvious rash/lesions  MUSCULOSKELETAL: no joint deformity  NEURO: no focal deficit; NL affect    EKG:    ASSESSMENT & PLAN:  CAD, ARTERY BYPASS GRAFT Recommend continued conservative management with medical therapy, notable for ASA and Toprol. Patient had a recent normal nuclear imaging study. I did consider adding low-dose long-acting nitrate for treatment of her occasional sharp CP. However, she is nitrate intolerant and denies any acceleration of this pattern, since undergoing remote CABG.  CAROTID BRUIT, RIGHT We'll order carotid Dopplers to rule out significant ICA disease progression.  HYPERLIPIDEMIA-MIXED Patient has reported intolerance to multiple statins.  MITRAL REGURGITATION Continue monitoring clinically with conservative management recommended, given her advanced age.  HYPERTENSION, UNSPECIFIED Well-controlled on current medication regimen.    Gene Kadi Hession, PAC

## 2013-03-11 NOTE — Assessment & Plan Note (Signed)
Patient has reported intolerance to multiple statins.

## 2013-03-11 NOTE — Patient Instructions (Signed)
Continue all current medications. Carotid dopplers Office will contact with results Your physician wants you to follow up in: 6 months.  You will receive a reminder letter in the mail one-two months in advance.  If you don't receive a letter, please call our office to schedule the follow up appointment

## 2013-03-11 NOTE — Assessment & Plan Note (Addendum)
We'll order carotid Dopplers to rule out significant ICA disease progression.

## 2013-03-11 NOTE — Assessment & Plan Note (Signed)
Continue monitoring clinically with conservative management recommended, given her advanced age.

## 2013-03-11 NOTE — Assessment & Plan Note (Signed)
Well-controlled on current medication regimen 

## 2013-03-11 NOTE — Assessment & Plan Note (Addendum)
Recommend continued conservative management with medical therapy, notable for ASA and Toprol. Patient had a recent normal nuclear imaging study. I did consider adding low-dose long-acting nitrate for treatment of her occasional sharp CP. However, she is nitrate intolerant and denies any acceleration of this pattern, since undergoing remote CABG.

## 2013-03-12 ENCOUNTER — Other Ambulatory Visit: Payer: Self-pay | Admitting: Physician Assistant

## 2013-03-15 ENCOUNTER — Other Ambulatory Visit: Payer: Self-pay | Admitting: *Deleted

## 2013-03-15 ENCOUNTER — Encounter: Payer: Self-pay | Admitting: *Deleted

## 2013-03-15 DIAGNOSIS — R0989 Other specified symptoms and signs involving the circulatory and respiratory systems: Secondary | ICD-10-CM

## 2013-03-18 ENCOUNTER — Encounter (INDEPENDENT_AMBULATORY_CARE_PROVIDER_SITE_OTHER): Payer: Medicare Other

## 2013-03-18 DIAGNOSIS — R0989 Other specified symptoms and signs involving the circulatory and respiratory systems: Secondary | ICD-10-CM

## 2013-03-23 ENCOUNTER — Telehealth: Payer: Self-pay | Admitting: *Deleted

## 2013-03-23 NOTE — Telephone Encounter (Signed)
Message copied by Lesle Chris on Tue Mar 23, 2013 12:05 PM ------      Message from: Prescott Parma C      Created: Mon Mar 22, 2013 11:23 AM       Moderate bilateral ICA disease, recommended repeat study in one year. ------

## 2013-03-23 NOTE — Telephone Encounter (Signed)
Notes Recorded by Lesle Chris, LPN on 03/03/8118 at 12:04 PM Patient notified and verbalized understanding.

## 2013-03-29 DIAGNOSIS — D485 Neoplasm of uncertain behavior of skin: Secondary | ICD-10-CM | POA: Diagnosis not present

## 2013-03-29 DIAGNOSIS — D0439 Carcinoma in situ of skin of other parts of face: Secondary | ICD-10-CM | POA: Diagnosis not present

## 2013-03-29 DIAGNOSIS — L820 Inflamed seborrheic keratosis: Secondary | ICD-10-CM | POA: Diagnosis not present

## 2013-03-29 DIAGNOSIS — L570 Actinic keratosis: Secondary | ICD-10-CM | POA: Diagnosis not present

## 2013-03-29 DIAGNOSIS — L723 Sebaceous cyst: Secondary | ICD-10-CM | POA: Diagnosis not present

## 2013-03-30 DIAGNOSIS — R079 Chest pain, unspecified: Secondary | ICD-10-CM | POA: Diagnosis not present

## 2013-03-30 DIAGNOSIS — J45901 Unspecified asthma with (acute) exacerbation: Secondary | ICD-10-CM | POA: Diagnosis not present

## 2013-03-30 DIAGNOSIS — J438 Other emphysema: Secondary | ICD-10-CM | POA: Diagnosis not present

## 2013-04-05 DIAGNOSIS — M5430 Sciatica, unspecified side: Secondary | ICD-10-CM | POA: Diagnosis not present

## 2013-04-05 DIAGNOSIS — E039 Hypothyroidism, unspecified: Secondary | ICD-10-CM | POA: Diagnosis not present

## 2013-04-08 DIAGNOSIS — C44320 Squamous cell carcinoma of skin of unspecified parts of face: Secondary | ICD-10-CM | POA: Diagnosis not present

## 2013-04-08 DIAGNOSIS — L570 Actinic keratosis: Secondary | ICD-10-CM | POA: Diagnosis not present

## 2013-05-03 DIAGNOSIS — M129 Arthropathy, unspecified: Secondary | ICD-10-CM | POA: Diagnosis not present

## 2013-05-03 DIAGNOSIS — J438 Other emphysema: Secondary | ICD-10-CM | POA: Diagnosis not present

## 2013-05-03 DIAGNOSIS — N3090 Cystitis, unspecified without hematuria: Secondary | ICD-10-CM | POA: Diagnosis not present

## 2013-06-09 DIAGNOSIS — E039 Hypothyroidism, unspecified: Secondary | ICD-10-CM | POA: Diagnosis not present

## 2013-06-09 DIAGNOSIS — M129 Arthropathy, unspecified: Secondary | ICD-10-CM | POA: Diagnosis not present

## 2013-06-09 DIAGNOSIS — M5430 Sciatica, unspecified side: Secondary | ICD-10-CM | POA: Diagnosis not present

## 2013-06-09 DIAGNOSIS — J438 Other emphysema: Secondary | ICD-10-CM | POA: Diagnosis not present

## 2013-06-30 DIAGNOSIS — T148XXA Other injury of unspecified body region, initial encounter: Secondary | ICD-10-CM | POA: Diagnosis not present

## 2013-06-30 DIAGNOSIS — J438 Other emphysema: Secondary | ICD-10-CM | POA: Diagnosis not present

## 2013-08-10 DIAGNOSIS — M129 Arthropathy, unspecified: Secondary | ICD-10-CM | POA: Diagnosis not present

## 2013-08-30 ENCOUNTER — Ambulatory Visit (INDEPENDENT_AMBULATORY_CARE_PROVIDER_SITE_OTHER): Payer: Medicare Other | Admitting: Cardiology

## 2013-08-30 ENCOUNTER — Encounter: Payer: Self-pay | Admitting: Cardiology

## 2013-08-30 VITALS — BP 114/67 | HR 76 | Ht 65.0 in | Wt 131.0 lb

## 2013-08-30 DIAGNOSIS — I10 Essential (primary) hypertension: Secondary | ICD-10-CM

## 2013-08-30 DIAGNOSIS — I359 Nonrheumatic aortic valve disorder, unspecified: Secondary | ICD-10-CM

## 2013-08-30 DIAGNOSIS — I2510 Atherosclerotic heart disease of native coronary artery without angina pectoris: Secondary | ICD-10-CM | POA: Diagnosis not present

## 2013-08-30 DIAGNOSIS — I6529 Occlusion and stenosis of unspecified carotid artery: Secondary | ICD-10-CM

## 2013-08-30 DIAGNOSIS — E785 Hyperlipidemia, unspecified: Secondary | ICD-10-CM

## 2013-08-30 DIAGNOSIS — I350 Nonrheumatic aortic (valve) stenosis: Secondary | ICD-10-CM | POA: Insufficient documentation

## 2013-08-30 NOTE — Assessment & Plan Note (Signed)
Moderate bilateral ICA disease as noted above.

## 2013-08-30 NOTE — Assessment & Plan Note (Signed)
Symptomatically stable on minimal medical regimen, essentially on aspirin. She is not able take nitroglycerin due to severe hypotension. Cardiolite from last year was low risk. Continue conservative management.

## 2013-08-30 NOTE — Progress Notes (Signed)
Clinical Summary Ms. Kunka is a 79 y.o.female last seen in the office by Mr. Serpe PA-C in April. She is a former patient of Dr. Andee Lineman. She does report occasional chest tightness, nothing progressive. Her ambulation is limited, she uses a cane. Denies any falls. She still lives in her own home in [place] and does her ADLs.  Lexiscan Cardiolite from August 2013 was negative for ischemia with LVEF 58%. Carotid Dopplers from April of this year revealed 40-59% bilateral ICA stenoses.  ECG today shows sinus rhythm with a PVC.  Allergies  Allergen Reactions  . Nitroglycerin     REACTION: bp drop  . Penicillins     REACTION: rash  . Statins   . Aleve [Naproxen] Rash    Current Outpatient Prescriptions  Medication Sig Dispense Refill  . albuterol (PROAIR HFA) 108 (90 BASE) MCG/ACT inhaler Inhale 2 puffs into the lungs every 6 (six) hours as needed for wheezing.      Marland Kitchen albuterol (PROVENTIL) (2.5 MG/3ML) 0.083% nebulizer solution Take 2.5 mg by nebulization every 6 (six) hours as needed.        Marland Kitchen aspirin EC 81 MG tablet Take 81 mg by mouth daily.      . Biotin (BIOTIN 5000) 5 MG CAPS Take 2 capsules by mouth daily.      . Calcium Carb-Cholecalciferol (CALCIUM + D3) 600-200 MG-UNIT TABS Take 1 tablet by mouth 2 (two) times daily.       . Cholecalciferol (D3-1000) 1000 UNITS capsule Take 1,000 Units by mouth daily.      Marland Kitchen esomeprazole (NEXIUM) 40 MG capsule Take 40 mg by mouth daily before breakfast.       . HYDROcodone-acetaminophen (NORCO/VICODIN) 5-325 MG per tablet Take 1 tablet by mouth 3 (three) times daily as needed.      Marland Kitchen levothyroxine (SYNTHROID, LEVOTHROID) 112 MCG tablet Take 112 mcg by mouth daily.        . Lutein-Zeaxanthin 45-1.8 MG CAPS Take 1 capsule by mouth daily.      . magnesium gluconate (MAGONATE) 500 MG tablet Take 500 mg by mouth daily.      . Multiple Vitamins-Minerals (CENTRUM SILVER PO) Take 1 tablet by mouth daily.        Marland Kitchen oxyCODONE (OXY IR/ROXICODONE) 5 MG  immediate release tablet Take 1 tablet by mouth 2 (two) times daily.      . Potassium 99 MG TABS Take 198 mg by mouth at bedtime.       . pyridoxine (B-6) 200 MG tablet Take 200 mg by mouth daily.      . raloxifene (EVISTA) 60 MG tablet Take 60 mg by mouth daily.        . vitamin B-12 (CYANOCOBALAMIN) 500 MCG tablet Take 500 mcg by mouth daily.        . VOLTAREN 1 % GEL Apply 2 g topically 2 (two) times daily.        No current facility-administered medications for this visit.    Past Medical History  Diagnosis Date  . Coronary atherosclerosis of native coronary artery     Multivessel status post CABG  . Essential hypertension, benign   . Mixed hyperlipidemia     Statin intolerant  . Mitral regurgitation     Moderate, 06/2012  . Aortic stenosis     Mild, 06/2012  . Hypothyroidism   . GERD (gastroesophageal reflux disease)     Past Surgical History  Procedure Laterality Date  . Coronary artery bypass graft  LIMA to LAD, SVG to diagonal    Social History Ms. Kalina reports that she has been passively smoking Cigarettes.  She has been smoking about 0.00 packs per day. She has never used smokeless tobacco. Ms. Kindall reports that she does not drink alcohol.  Review of Systems No palpitations or dizziness. No claudication, no orthopnea or PND. No reported bleeding problems. Chronic arthritic pain.  Physical Examination Filed Vitals:   08/30/13 1259  BP: 114/67  Pulse: 76   Filed Weights   08/30/13 1259  Weight: 131 lb (59.421 kg)   Elderly woman, appears comfortable. HEENT: Conjunctiva and lids normal, oropharynx clear. Neck: Supple, no elevated JVP, soft carotid bruits, no thyromegaly. Lungs: Clear to auscultation, nonlabored breathing at rest. Cardiac: Regular rate and rhythm, no S3, 2/6 systolic murmur, no pericardial rub. Abdomen: Soft, nontender, bowel sounds present. Extremities: No pitting edema, distal pulses 1-2+. Skin: Warm and dry. Musculoskeletal: Mild  kyphosis. Neuropsychiatric: Alert and oriented x3, affect grossly appropriate.   Problem List and Plan   Coronary atherosclerosis of native coronary artery Symptomatically stable on minimal medical regimen, essentially on aspirin. She is not able take nitroglycerin due to severe hypotension. Cardiolite from last year was low risk. Continue conservative management.  Essential hypertension, benign Blood pressure is normal today.  HYPERLIPIDEMIA-MIXED Patient has statin intolerance.  Carotid artery occlusion and stenosis Moderate bilateral ICA disease as noted above.    Jonelle Sidle, M.D., F.A.C.C.

## 2013-08-30 NOTE — Assessment & Plan Note (Signed)
Blood pressure is normal today. 

## 2013-08-30 NOTE — Assessment & Plan Note (Signed)
Patient has statin intolerance. 

## 2013-08-30 NOTE — Patient Instructions (Addendum)

## 2013-09-22 DIAGNOSIS — M5430 Sciatica, unspecified side: Secondary | ICD-10-CM | POA: Diagnosis not present

## 2013-09-22 DIAGNOSIS — J438 Other emphysema: Secondary | ICD-10-CM | POA: Diagnosis not present

## 2013-09-22 DIAGNOSIS — Z23 Encounter for immunization: Secondary | ICD-10-CM | POA: Diagnosis not present

## 2013-09-22 DIAGNOSIS — M129 Arthropathy, unspecified: Secondary | ICD-10-CM | POA: Diagnosis not present

## 2013-09-22 DIAGNOSIS — E039 Hypothyroidism, unspecified: Secondary | ICD-10-CM | POA: Diagnosis not present

## 2013-10-26 ENCOUNTER — Encounter: Payer: Self-pay | Admitting: Cardiology

## 2013-10-26 DIAGNOSIS — F411 Generalized anxiety disorder: Secondary | ICD-10-CM | POA: Diagnosis not present

## 2013-10-26 DIAGNOSIS — J45901 Unspecified asthma with (acute) exacerbation: Secondary | ICD-10-CM | POA: Diagnosis not present

## 2013-10-26 DIAGNOSIS — R0602 Shortness of breath: Secondary | ICD-10-CM | POA: Diagnosis not present

## 2013-10-26 DIAGNOSIS — J441 Chronic obstructive pulmonary disease with (acute) exacerbation: Secondary | ICD-10-CM | POA: Diagnosis not present

## 2013-10-26 DIAGNOSIS — R0902 Hypoxemia: Secondary | ICD-10-CM | POA: Diagnosis not present

## 2013-10-26 DIAGNOSIS — J189 Pneumonia, unspecified organism: Secondary | ICD-10-CM | POA: Diagnosis not present

## 2013-10-26 DIAGNOSIS — Z9861 Coronary angioplasty status: Secondary | ICD-10-CM | POA: Diagnosis not present

## 2013-10-26 DIAGNOSIS — M479 Spondylosis, unspecified: Secondary | ICD-10-CM | POA: Diagnosis not present

## 2013-10-26 DIAGNOSIS — R0682 Tachypnea, not elsewhere classified: Secondary | ICD-10-CM | POA: Diagnosis not present

## 2013-10-26 DIAGNOSIS — I10 Essential (primary) hypertension: Secondary | ICD-10-CM | POA: Diagnosis not present

## 2013-10-26 DIAGNOSIS — J438 Other emphysema: Secondary | ICD-10-CM | POA: Diagnosis not present

## 2013-10-26 DIAGNOSIS — Z880 Allergy status to penicillin: Secondary | ICD-10-CM | POA: Diagnosis not present

## 2013-10-26 DIAGNOSIS — E039 Hypothyroidism, unspecified: Secondary | ICD-10-CM | POA: Diagnosis not present

## 2013-10-26 DIAGNOSIS — F329 Major depressive disorder, single episode, unspecified: Secondary | ICD-10-CM | POA: Diagnosis not present

## 2013-10-26 DIAGNOSIS — R0789 Other chest pain: Secondary | ICD-10-CM | POA: Diagnosis not present

## 2013-10-26 DIAGNOSIS — Z951 Presence of aortocoronary bypass graft: Secondary | ICD-10-CM | POA: Diagnosis not present

## 2013-10-26 DIAGNOSIS — R059 Cough, unspecified: Secondary | ICD-10-CM | POA: Diagnosis not present

## 2013-10-26 DIAGNOSIS — E785 Hyperlipidemia, unspecified: Secondary | ICD-10-CM | POA: Diagnosis not present

## 2013-10-26 DIAGNOSIS — R079 Chest pain, unspecified: Secondary | ICD-10-CM | POA: Diagnosis not present

## 2013-10-26 DIAGNOSIS — Z79899 Other long term (current) drug therapy: Secondary | ICD-10-CM | POA: Diagnosis not present

## 2013-10-26 DIAGNOSIS — R062 Wheezing: Secondary | ICD-10-CM | POA: Diagnosis not present

## 2013-10-26 DIAGNOSIS — R05 Cough: Secondary | ICD-10-CM | POA: Diagnosis not present

## 2013-10-26 DIAGNOSIS — Z9981 Dependence on supplemental oxygen: Secondary | ICD-10-CM | POA: Diagnosis not present

## 2013-10-27 ENCOUNTER — Encounter: Payer: Self-pay | Admitting: Cardiology

## 2013-10-27 DIAGNOSIS — R0602 Shortness of breath: Secondary | ICD-10-CM | POA: Diagnosis not present

## 2013-10-27 DIAGNOSIS — R0609 Other forms of dyspnea: Secondary | ICD-10-CM | POA: Diagnosis not present

## 2013-10-27 DIAGNOSIS — J441 Chronic obstructive pulmonary disease with (acute) exacerbation: Secondary | ICD-10-CM | POA: Diagnosis not present

## 2013-10-27 DIAGNOSIS — J189 Pneumonia, unspecified organism: Secondary | ICD-10-CM | POA: Diagnosis not present

## 2013-11-26 DIAGNOSIS — M129 Arthropathy, unspecified: Secondary | ICD-10-CM | POA: Diagnosis not present

## 2013-11-26 DIAGNOSIS — J438 Other emphysema: Secondary | ICD-10-CM | POA: Diagnosis not present

## 2014-01-03 DIAGNOSIS — H2510 Age-related nuclear cataract, unspecified eye: Secondary | ICD-10-CM | POA: Diagnosis not present

## 2014-01-04 ENCOUNTER — Other Ambulatory Visit: Payer: Self-pay | Admitting: *Deleted

## 2014-01-04 DIAGNOSIS — I6529 Occlusion and stenosis of unspecified carotid artery: Secondary | ICD-10-CM

## 2014-01-24 DIAGNOSIS — H2510 Age-related nuclear cataract, unspecified eye: Secondary | ICD-10-CM | POA: Diagnosis not present

## 2014-02-07 DIAGNOSIS — H2589 Other age-related cataract: Secondary | ICD-10-CM | POA: Diagnosis not present

## 2014-02-07 DIAGNOSIS — H2510 Age-related nuclear cataract, unspecified eye: Secondary | ICD-10-CM | POA: Diagnosis not present

## 2014-02-07 DIAGNOSIS — H269 Unspecified cataract: Secondary | ICD-10-CM | POA: Diagnosis not present

## 2014-02-28 ENCOUNTER — Ambulatory Visit: Payer: Medicare Other | Admitting: Cardiology

## 2014-03-03 ENCOUNTER — Ambulatory Visit (INDEPENDENT_AMBULATORY_CARE_PROVIDER_SITE_OTHER): Payer: Medicare Other | Admitting: Cardiology

## 2014-03-03 ENCOUNTER — Encounter: Payer: Self-pay | Admitting: Cardiology

## 2014-03-03 VITALS — BP 109/63 | HR 74 | Ht 65.0 in | Wt 128.8 lb

## 2014-03-03 DIAGNOSIS — I6529 Occlusion and stenosis of unspecified carotid artery: Secondary | ICD-10-CM

## 2014-03-03 DIAGNOSIS — I359 Nonrheumatic aortic valve disorder, unspecified: Secondary | ICD-10-CM | POA: Diagnosis not present

## 2014-03-03 DIAGNOSIS — I10 Essential (primary) hypertension: Secondary | ICD-10-CM

## 2014-03-03 DIAGNOSIS — I2510 Atherosclerotic heart disease of native coronary artery without angina pectoris: Secondary | ICD-10-CM | POA: Diagnosis not present

## 2014-03-03 DIAGNOSIS — I350 Nonrheumatic aortic (valve) stenosis: Secondary | ICD-10-CM

## 2014-03-03 NOTE — Assessment & Plan Note (Signed)
Mild, no change in examination. 

## 2014-03-03 NOTE — Patient Instructions (Signed)
Your physician recommends that you schedule a follow-up appointment in: 6 months. You will receive a reminder letter in the mail in about 4 months reminding you to call and schedule your appointment. If you don't receive this letter, please contact our office. Your physician recommends that you continue on your current medications as directed. Please refer to the Current Medication list given to you today. Please keep your upcoming appointment for your carotid doppler.

## 2014-03-03 NOTE — Progress Notes (Signed)
Clinical Summary Carmen Vaughn is a 80 y.o.female last seen in October 2014. She voices no complaints of progressive angina, able to do her ADLs, lives in her own home still. Her daughter checks on her regularly. She reports compliance with her medications, no falls.  Lexiscan Cardiolite from August 2013 was negative for ischemia with LVEF 58%. Carotid Dopplers from April 2014 revealed 40-59% bilateral ICA stenoses.   Allergies  Allergen Reactions  . Nitroglycerin     REACTION: bp drop  . Penicillins     REACTION: rash  . Statins   . Aleve [Naproxen] Rash    Current Outpatient Prescriptions  Medication Sig Dispense Refill  . albuterol (PROAIR HFA) 108 (90 BASE) MCG/ACT inhaler Inhale 2 puffs into the lungs every 6 (six) hours as needed for wheezing.      Marland Kitchen albuterol (PROVENTIL) (2.5 MG/3ML) 0.083% nebulizer solution Take 2.5 mg by nebulization every 6 (six) hours as needed.        . Ascorbic Acid (VITAMIN C) 1000 MG tablet Take 1,000 mg by mouth daily.      . Biotin (BIOTIN 5000) 5 MG CAPS Take 2 capsules by mouth daily.      . Calcium Carb-Cholecalciferol (CALCIUM + D3) 600-200 MG-UNIT TABS Take 1 tablet by mouth 2 (two) times daily.       . Cholecalciferol (D3-1000) 1000 UNITS capsule Take 1,000 Units by mouth daily.      Marland Kitchen esomeprazole (NEXIUM) 40 MG capsule Take 40 mg by mouth daily before breakfast.       . HYDROcodone-acetaminophen (NORCO/VICODIN) 5-325 MG per tablet Take 1 tablet by mouth 3 (three) times daily as needed.      Marland Kitchen levothyroxine (SYNTHROID, LEVOTHROID) 112 MCG tablet Take 112 mcg by mouth daily.        . magnesium gluconate (MAGONATE) 500 MG tablet Take 500 mg by mouth daily.      . Multiple Vitamins-Minerals (CENTRUM SILVER PO) Take 1 tablet by mouth daily.        . Potassium 99 MG TABS Take 198 mg by mouth at bedtime.       . pyridoxine (B-6) 200 MG tablet Take 200 mg by mouth daily.      . raloxifene (EVISTA) 60 MG tablet Take 60 mg by mouth daily.        .  vitamin B-12 (CYANOCOBALAMIN) 500 MCG tablet Take 500 mcg by mouth daily.        . VOLTAREN 1 % GEL Apply 2 g topically 2 (two) times daily.       . Lutein-Zeaxanthin 45-1.8 MG CAPS Take 1 capsule by mouth daily.       No current facility-administered medications for this visit.    Past Medical History  Diagnosis Date  . Coronary atherosclerosis of native coronary artery     Multivessel status post CABG  . Essential hypertension, benign   . Mixed hyperlipidemia     Statin intolerant  . Mitral regurgitation     Moderate, 06/2012  . Aortic stenosis     Mild, 06/2012  . Hypothyroidism   . GERD (gastroesophageal reflux disease)     Past Surgical History  Procedure Laterality Date  . Coronary artery bypass graft      LIMA to LAD, SVG to diagonal    Social History Ms. Partch reports that she has been passively smoking Cigarettes.  She has been smoking about 0.00 packs per day. She has never used smokeless tobacco. Ms. Hines reports that  she does not drink alcohol.  Review of Systems Diffuse arthritic pains, particularly in the damp whether. Otherwise as outlined.  Physical Examination Filed Vitals:   03/03/14 1259  BP: 109/63  Pulse: 74   Filed Weights   03/03/14 1259  Weight: 128 lb 12.8 oz (58.423 kg)    Elderly woman, appears comfortable.  HEENT: Conjunctiva and lids normal, oropharynx clear.  Neck: Supple, no elevated JVP, soft carotid bruits, no thyromegaly.  Lungs: Clear to auscultation, nonlabored breathing at rest.  Cardiac: Regular rate and rhythm, no S3, 2/6 systolic murmur, no pericardial rub.  Abdomen: Soft, nontender, bowel sounds present.  Extremities: No pitting edema, distal pulses 1-2+.  Skin: Warm and dry.  Musculoskeletal: Mild kyphosis.  Neuropsychiatric: Alert and oriented x3, affect grossly appropriate.   Problem List and Plan   Coronary atherosclerosis of native coronary artery No active angina on medical therapy. Cardiolite within the last 2  years was negative for ischemia. Continue observation.  Essential hypertension, benign Blood pressure is normal today.  Aortic stenosis Mild, no change in examination.  Occlusion and stenosis of carotid artery without mention of cerebral infarction 40-59% bilateral ICA stenoses, due for followup carotid Dopplers this month.    Satira Sark, M.D., F.A.C.C.

## 2014-03-03 NOTE — Assessment & Plan Note (Signed)
Blood pressure is normal today. 

## 2014-03-03 NOTE — Assessment & Plan Note (Signed)
No active angina on medical therapy. Cardiolite within the last 2 years was negative for ischemia. Continue observation.

## 2014-03-03 NOTE — Assessment & Plan Note (Signed)
40-59% bilateral ICA stenoses, due for followup carotid Dopplers this month.

## 2014-03-07 DIAGNOSIS — M461 Sacroiliitis, not elsewhere classified: Secondary | ICD-10-CM | POA: Diagnosis not present

## 2014-03-07 DIAGNOSIS — M810 Age-related osteoporosis without current pathological fracture: Secondary | ICD-10-CM | POA: Diagnosis not present

## 2014-03-07 DIAGNOSIS — M659 Synovitis and tenosynovitis, unspecified: Secondary | ICD-10-CM | POA: Diagnosis not present

## 2014-03-07 DIAGNOSIS — N393 Stress incontinence (female) (male): Secondary | ICD-10-CM | POA: Diagnosis not present

## 2014-03-07 DIAGNOSIS — E039 Hypothyroidism, unspecified: Secondary | ICD-10-CM | POA: Diagnosis not present

## 2014-03-09 ENCOUNTER — Encounter (INDEPENDENT_AMBULATORY_CARE_PROVIDER_SITE_OTHER): Payer: Medicare Other

## 2014-03-09 DIAGNOSIS — I6529 Occlusion and stenosis of unspecified carotid artery: Secondary | ICD-10-CM

## 2014-03-11 ENCOUNTER — Telehealth: Payer: Self-pay | Admitting: *Deleted

## 2014-03-11 NOTE — Telephone Encounter (Signed)
Message copied by Merlene Laughter on Fri Mar 11, 2014  1:29 PM ------      Message from: MCDOWELL, Aloha Gell      Created: Thu Mar 10, 2014  7:30 AM       Reviewed report. Indicates stable 40-59% bilateral ICA stenoses. Continue medical therapy and observation for now. ------

## 2014-03-14 NOTE — Telephone Encounter (Signed)
Notes Recorded by Laurine Blazer, LPN on 0/26/3785 at 8:85 PM Patient notified and verbalized understanding.

## 2014-03-14 NOTE — Telephone Encounter (Signed)
Message copied by Laurine Blazer on Mon Mar 14, 2014  3:47 PM ------      Message from: MCDOWELL, Aloha Gell      Created: Thu Mar 10, 2014  7:30 AM       Reviewed report. Indicates stable 40-59% bilateral ICA stenoses. Continue medical therapy and observation for now. ------

## 2014-04-07 DIAGNOSIS — N393 Stress incontinence (female) (male): Secondary | ICD-10-CM | POA: Diagnosis not present

## 2014-04-07 DIAGNOSIS — M659 Synovitis and tenosynovitis, unspecified: Secondary | ICD-10-CM | POA: Diagnosis not present

## 2014-04-07 DIAGNOSIS — E039 Hypothyroidism, unspecified: Secondary | ICD-10-CM | POA: Diagnosis not present

## 2014-04-07 DIAGNOSIS — M810 Age-related osteoporosis without current pathological fracture: Secondary | ICD-10-CM | POA: Diagnosis not present

## 2014-06-27 DIAGNOSIS — J438 Other emphysema: Secondary | ICD-10-CM | POA: Diagnosis not present

## 2014-06-27 DIAGNOSIS — J45901 Unspecified asthma with (acute) exacerbation: Secondary | ICD-10-CM | POA: Diagnosis not present

## 2014-08-04 DIAGNOSIS — M810 Age-related osteoporosis without current pathological fracture: Secondary | ICD-10-CM | POA: Diagnosis not present

## 2014-08-04 DIAGNOSIS — E538 Deficiency of other specified B group vitamins: Secondary | ICD-10-CM | POA: Diagnosis not present

## 2014-08-04 DIAGNOSIS — E039 Hypothyroidism, unspecified: Secondary | ICD-10-CM | POA: Diagnosis not present

## 2014-08-04 DIAGNOSIS — R634 Abnormal weight loss: Secondary | ICD-10-CM | POA: Diagnosis not present

## 2014-08-04 DIAGNOSIS — M129 Arthropathy, unspecified: Secondary | ICD-10-CM | POA: Diagnosis not present

## 2014-08-04 DIAGNOSIS — J438 Other emphysema: Secondary | ICD-10-CM | POA: Diagnosis not present

## 2014-08-11 DIAGNOSIS — Z23 Encounter for immunization: Secondary | ICD-10-CM | POA: Diagnosis not present

## 2014-08-11 DIAGNOSIS — J438 Other emphysema: Secondary | ICD-10-CM | POA: Diagnosis not present

## 2014-08-11 DIAGNOSIS — M810 Age-related osteoporosis without current pathological fracture: Secondary | ICD-10-CM | POA: Diagnosis not present

## 2014-08-11 DIAGNOSIS — E039 Hypothyroidism, unspecified: Secondary | ICD-10-CM | POA: Diagnosis not present

## 2014-08-11 DIAGNOSIS — M129 Arthropathy, unspecified: Secondary | ICD-10-CM | POA: Diagnosis not present

## 2014-08-11 DIAGNOSIS — N393 Stress incontinence (female) (male): Secondary | ICD-10-CM | POA: Diagnosis not present

## 2014-08-11 DIAGNOSIS — M5430 Sciatica, unspecified side: Secondary | ICD-10-CM | POA: Diagnosis not present

## 2014-08-16 DIAGNOSIS — J449 Chronic obstructive pulmonary disease, unspecified: Secondary | ICD-10-CM | POA: Diagnosis not present

## 2014-09-06 ENCOUNTER — Ambulatory Visit (INDEPENDENT_AMBULATORY_CARE_PROVIDER_SITE_OTHER): Payer: Medicare Other | Admitting: Cardiology

## 2014-09-06 ENCOUNTER — Encounter: Payer: Self-pay | Admitting: Cardiology

## 2014-09-06 VITALS — BP 115/72 | HR 86 | Ht 65.0 in | Wt 125.8 lb

## 2014-09-06 DIAGNOSIS — I2510 Atherosclerotic heart disease of native coronary artery without angina pectoris: Secondary | ICD-10-CM

## 2014-09-06 DIAGNOSIS — I350 Nonrheumatic aortic (valve) stenosis: Secondary | ICD-10-CM | POA: Diagnosis not present

## 2014-09-06 DIAGNOSIS — I6523 Occlusion and stenosis of bilateral carotid arteries: Secondary | ICD-10-CM

## 2014-09-06 DIAGNOSIS — I10 Essential (primary) hypertension: Secondary | ICD-10-CM | POA: Diagnosis not present

## 2014-09-06 NOTE — Assessment & Plan Note (Signed)
Blood pressure is well-controlled today. 

## 2014-09-06 NOTE — Assessment & Plan Note (Signed)
Stable 40-59% bilateral ICA stenoses.

## 2014-09-06 NOTE — Assessment & Plan Note (Signed)
Mild, no change in examination.

## 2014-09-06 NOTE — Assessment & Plan Note (Signed)
Symptomatically stable on medical therapy. No followup ischemic testing indicated at this time, we will continue observation.

## 2014-09-06 NOTE — Patient Instructions (Signed)

## 2014-09-06 NOTE — Progress Notes (Signed)
Clinical Summary Carmen Vaughn is a 80 y.o.female last seen in April. She denies any angina symptoms. Has been chronically short of breath, some of this is seasonal. She reports no hospitalizations, and states that she has been taking her medications regularly. Still following with Dr. Nadara Mustard.  Follow carotid Dopplers in April of this year showed stable 40-59% bilateral ICA stenoses.  Lexiscan Cardiolite from August 2013 was negative for ischemia with LVEF 58%.   Allergies  Allergen Reactions  . Nitroglycerin     REACTION: bp drop  . Penicillins     REACTION: rash  . Statins   . Aleve [Naproxen] Rash    Current Outpatient Prescriptions  Medication Sig Dispense Refill  . albuterol (PROVENTIL) (2.5 MG/3ML) 0.083% nebulizer solution Take 2.5 mg by nebulization every 6 (six) hours as needed.        . Ascorbic Acid (VITAMIN C) 1000 MG tablet Take 1,000 mg by mouth daily.      . beclomethasone (QVAR) 80 MCG/ACT inhaler Inhale 1 puff into the lungs 2 (two) times daily.      . Biotin (BIOTIN 5000) 5 MG CAPS Take 2 capsules by mouth daily.      . Calcium Carb-Cholecalciferol (CALCIUM + D3) 600-200 MG-UNIT TABS Take 1 tablet by mouth 2 (two) times daily.       . Cholecalciferol (D3-1000) 1000 UNITS capsule Take 1,000 Units by mouth daily.      Marland Kitchen esomeprazole (NEXIUM) 40 MG capsule Take 40 mg by mouth daily before breakfast.       . HYDROcodone-acetaminophen (NORCO/VICODIN) 5-325 MG per tablet Take 1 tablet by mouth 3 (three) times daily as needed.      Marland Kitchen levothyroxine (SYNTHROID, LEVOTHROID) 112 MCG tablet Take 112 mcg by mouth daily.        . Lutein-Zeaxanthin 45-1.8 MG CAPS Take 1 capsule by mouth daily.      . magnesium gluconate (MAGONATE) 500 MG tablet Take 500 mg by mouth daily.      . Multiple Vitamins-Minerals (CENTRUM SILVER PO) Take 1 tablet by mouth daily.        . Potassium 99 MG TABS Take 198 mg by mouth at bedtime.       . pyridoxine (B-6) 200 MG tablet Take 200 mg by mouth  daily.      . raloxifene (EVISTA) 60 MG tablet Take 60 mg by mouth daily.        . vitamin B-12 (CYANOCOBALAMIN) 500 MCG tablet Take 500 mcg by mouth daily.        . VOLTAREN 1 % GEL Apply 2 g topically 2 (two) times daily.        No current facility-administered medications for this visit.    Past Medical History  Diagnosis Date  . Coronary atherosclerosis of native coronary artery     Multivessel status post CABG  . Essential hypertension, benign   . Mixed hyperlipidemia     Statin intolerant  . Mitral regurgitation     Moderate, 06/2012  . Aortic stenosis     Mild, 06/2012  . Hypothyroidism   . GERD (gastroesophageal reflux disease)     Past Surgical History  Procedure Laterality Date  . Coronary artery bypass graft      LIMA to LAD, SVG to diagonal    Social History Carmen Vaughn reports that she has been passively smoking Cigarettes.  She has been smoking about 0.00 packs per day. She has never used smokeless tobacco. Carmen Vaughn reports that  she does not drink alcohol.  Review of Systems No palpitations. Remains functional in her ADLs. Has been under some stress, her daughter diagnosed with lung cancer. Other systems reviewed and negative.  Physical Examination Filed Vitals:   09/06/14 1520  BP: 115/72  Pulse: 86   Filed Weights   09/06/14 1520  Weight: 125 lb 12 oz (57.04 kg)    Elderly woman, appears comfortable.  HEENT: Conjunctiva and lids normal, oropharynx clear.  Neck: Supple, no elevated JVP, soft carotid bruits, no thyromegaly.  Lungs: Clear to auscultation, nonlabored breathing at rest.  Cardiac: Regular rate and rhythm, no S3, 2/6 systolic murmur, no pericardial rub.  Abdomen: Soft, nontender, bowel sounds present.  Extremities: No pitting edema, distal pulses 1-2+.  Skin: Warm and dry.  Musculoskeletal: Mild kyphosis.  Neuropsychiatric: Alert and oriented x3, affect grossly appropriate.   Problem List and Plan   Coronary atherosclerosis of native  coronary artery Symptomatically stable on medical therapy. No followup ischemic testing indicated at this time, we will continue observation.  Essential hypertension, benign Blood pressure is well-controlled today.  Carotid artery occlusion Stable 40-59% bilateral ICA stenoses.  Aortic stenosis Mild, no change in examination.    Satira Sark, M.D., F.A.C.C.

## 2014-09-26 DIAGNOSIS — N3000 Acute cystitis without hematuria: Secondary | ICD-10-CM | POA: Diagnosis not present

## 2014-12-29 DIAGNOSIS — E039 Hypothyroidism, unspecified: Secondary | ICD-10-CM | POA: Diagnosis not present

## 2014-12-29 DIAGNOSIS — N393 Stress incontinence (female) (male): Secondary | ICD-10-CM | POA: Diagnosis not present

## 2014-12-29 DIAGNOSIS — M4850XA Collapsed vertebra, not elsewhere classified, site unspecified, initial encounter for fracture: Secondary | ICD-10-CM | POA: Diagnosis not present

## 2014-12-29 DIAGNOSIS — J438 Other emphysema: Secondary | ICD-10-CM | POA: Diagnosis not present

## 2014-12-29 DIAGNOSIS — F328 Other depressive episodes: Secondary | ICD-10-CM | POA: Diagnosis not present

## 2015-02-28 DIAGNOSIS — M4850XA Collapsed vertebra, not elsewhere classified, site unspecified, initial encounter for fracture: Secondary | ICD-10-CM | POA: Diagnosis not present

## 2015-02-28 DIAGNOSIS — F328 Other depressive episodes: Secondary | ICD-10-CM | POA: Diagnosis not present

## 2015-02-28 DIAGNOSIS — E039 Hypothyroidism, unspecified: Secondary | ICD-10-CM | POA: Diagnosis not present

## 2015-02-28 DIAGNOSIS — M129 Arthropathy, unspecified: Secondary | ICD-10-CM | POA: Diagnosis not present

## 2015-02-28 DIAGNOSIS — J438 Other emphysema: Secondary | ICD-10-CM | POA: Diagnosis not present

## 2015-03-15 ENCOUNTER — Encounter: Payer: Self-pay | Admitting: Cardiology

## 2015-03-15 ENCOUNTER — Ambulatory Visit (INDEPENDENT_AMBULATORY_CARE_PROVIDER_SITE_OTHER): Payer: Medicare Other | Admitting: Cardiology

## 2015-03-15 VITALS — BP 112/72 | HR 74 | Ht 65.0 in | Wt 125.0 lb

## 2015-03-15 DIAGNOSIS — I6523 Occlusion and stenosis of bilateral carotid arteries: Secondary | ICD-10-CM | POA: Diagnosis not present

## 2015-03-15 DIAGNOSIS — I10 Essential (primary) hypertension: Secondary | ICD-10-CM

## 2015-03-15 DIAGNOSIS — I38 Endocarditis, valve unspecified: Secondary | ICD-10-CM

## 2015-03-15 DIAGNOSIS — I25119 Atherosclerotic heart disease of native coronary artery with unspecified angina pectoris: Secondary | ICD-10-CM

## 2015-03-15 NOTE — Patient Instructions (Signed)
Your physician recommends that you continue on your current medications as directed. Please refer to the Current Medication list given to you today. Your physician recommends that you schedule a follow-up appointment in: 6 months. You will receive a reminder letter in the mail in about 4 months reminding you to call and schedule your appointment. If you don't receive this letter, please contact our office. 

## 2015-03-15 NOTE — Progress Notes (Signed)
Cardiology Office Note  Date: 03/15/2015   ID: Mitsuye, Schrodt [birth year], MRN [id number]  PCP: Rory Percy, MD  Primary Cardiologist: Rozann Lesches, MD   Chief Complaint  Patient presents with  . Coronary Artery Disease  . Valvular heart disease    History of Present Illness: Carmen Vaughn is a 81 y.o. female last seen in October 2015. She presents for a routine follow-up visit. Mainly complains of arthritic pains and difficulty ambulating. She has occasional angina symptoms although not on a regular basis, and not progressive. She also reports sternal and costochondral discomfort that sounds musculoskeletal and inflammatory. Follow-up ECG is reviewed below.  She states that she is functional in her basic ADLs, but otherwise not very active. She does not exercise mainly due to arthritis.  She continues to follow with Dr. Nadara Mustard.   Past Medical History  Diagnosis Date  . Coronary atherosclerosis of native coronary artery     Multivessel status post CABG  . Essential hypertension, benign   . Mixed hyperlipidemia     Statin intolerant  . Mitral regurgitation     Moderate, 06/2012  . Aortic stenosis     Mild, 06/2012  . Hypothyroidism   . GERD (gastroesophageal reflux disease)     Past Surgical History  Procedure Laterality Date  . Coronary artery bypass graft      LIMA to LAD, SVG to diagonal    Current Outpatient Prescriptions  Medication Sig Dispense Refill  . albuterol (PROVENTIL) (2.5 MG/3ML) 0.083% nebulizer solution Take 2.5 mg by nebulization every 6 (six) hours as needed.      . Ascorbic Acid (VITAMIN C) 1000 MG tablet Take 1,000 mg by mouth daily.    . beclomethasone (QVAR) 80 MCG/ACT inhaler Inhale 1 puff into the lungs 2 (two) times daily.    . Biotin (BIOTIN 5000) 5 MG CAPS Take 2 capsules by mouth daily.    . Calcium Carb-Cholecalciferol (CALCIUM + D3) 600-200 MG-UNIT TABS Take 1 tablet by mouth 2 (two) times daily.     Marland Kitchen esomeprazole (NEXIUM) 40  MG capsule Take 40 mg by mouth daily before breakfast.     . HYDROcodone-acetaminophen (NORCO/VICODIN) 5-325 MG per tablet Take 1 tablet by mouth 3 (three) times daily as needed.    Marland Kitchen levothyroxine (SYNTHROID, LEVOTHROID) 112 MCG tablet Take 112 mcg by mouth daily.      . Lutein-Zeaxanthin 45-1.8 MG CAPS Take 1 capsule by mouth daily.    . magnesium gluconate (MAGONATE) 500 MG tablet Take 500 mg by mouth daily.    . Multiple Vitamins-Minerals (CENTRUM SILVER PO) Take 1 tablet by mouth daily.      . Potassium 99 MG TABS Take 198 mg by mouth at bedtime.     . pyridoxine (B-6) 200 MG tablet Take 200 mg by mouth daily.    . raloxifene (EVISTA) 60 MG tablet Take 60 mg by mouth daily.       No current facility-administered medications for this visit.    Allergies:  Nitroglycerin; Penicillins; Statins; and Aleve   Social History: The patient  reports that she has been passively smoking Cigarettes.  She has never used smokeless tobacco. She reports that she does not drink alcohol or use illicit drugs.   ROS:  Please see the history of present illness. Otherwise, complete review of systems is positive for decreased hearing.  All other systems are reviewed and negative.   Physical Exam: VS:  BP 112/72 mmHg  Pulse 74  Ht 5\' 5"  (1.651 m)  Wt 125 lb (56.7 kg)  BMI 20.80 kg/m2  SpO2 95%, BMI Body mass index is 20.8 kg/(m^2).  Wt Readings from Last 3 Encounters:  03/15/15 125 lb (56.7 kg)  09/06/14 125 lb 12 oz (57.04 kg)  03/03/14 128 lb 12.8 oz (58.423 kg)     Elderly woman, appears comfortable.  HEENT: Conjunctiva normal, oropharynx clear.  Neck: Supple, no elevated JVP, soft carotid bruits, no thyromegaly.  Lungs: Clear to auscultation, nonlabored breathing at rest.  Cardiac: Regular rate and rhythm, no S3, 2/6 systolic murmur, no pericardial rub.  Abdomen: Soft, nontender, bowel sounds present.  Extremities: No pitting edema, distal pulses 1-2+.  Skin: Warm and dry.   Musculoskeletal: Mild kyphosis.  Neuropsychiatric: Alert and oriented x3, affect grossly appropriate.   ECG: ECG is ordered today and reviewed showing sinus rhythm with borderline low voltage and nonspecific T-wave changes.   Other Studies Reviewed Today:  1. Lexiscan Cardiolite from August 2013 was negative for ischemia with LVEF 58%.  2. Carotid Dopplers 03/09/2014 showed 40-59% bilateral ICA stenoses.   Assessment and Plan:  1. CAD status post CABG, symptomatically stable. ECG also reviewed and stable. We will plan to continue observation at this time.  2. Valvular heart disease including previously documented mild aortic stenosis and moderate mitral regurgitation. No significant change on examination.  3. History of carotid artery disease as documented by Dopplers last year, 40-59% bilateral ICA stenoses.  Current medicines were reviewed with the patient today.   Orders Placed This Encounter  Procedures  . EKG 12-Lead    Disposition: FU with me in 6 months.   Signed, Satira Sark, MD, [facility] 03/15/2015 1:22 PM    [facility] [place], [place], [place] [address] Phone: [phone]; Fax: [phone]

## 2015-03-28 DIAGNOSIS — M4850XA Collapsed vertebra, not elsewhere classified, site unspecified, initial encounter for fracture: Secondary | ICD-10-CM | POA: Diagnosis not present

## 2015-03-28 DIAGNOSIS — N393 Stress incontinence (female) (male): Secondary | ICD-10-CM | POA: Diagnosis not present

## 2015-03-28 DIAGNOSIS — M129 Arthropathy, unspecified: Secondary | ICD-10-CM | POA: Diagnosis not present

## 2015-03-28 DIAGNOSIS — J438 Other emphysema: Secondary | ICD-10-CM | POA: Diagnosis not present

## 2015-03-28 DIAGNOSIS — F328 Other depressive episodes: Secondary | ICD-10-CM | POA: Diagnosis not present

## 2015-03-28 DIAGNOSIS — E039 Hypothyroidism, unspecified: Secondary | ICD-10-CM | POA: Diagnosis not present

## 2015-06-29 DIAGNOSIS — J438 Other emphysema: Secondary | ICD-10-CM | POA: Diagnosis not present

## 2015-06-29 DIAGNOSIS — E039 Hypothyroidism, unspecified: Secondary | ICD-10-CM | POA: Diagnosis not present

## 2015-06-29 DIAGNOSIS — M129 Arthropathy, unspecified: Secondary | ICD-10-CM | POA: Diagnosis not present

## 2015-06-29 DIAGNOSIS — M4850XA Collapsed vertebra, not elsewhere classified, site unspecified, initial encounter for fracture: Secondary | ICD-10-CM | POA: Diagnosis not present

## 2015-06-29 DIAGNOSIS — R300 Dysuria: Secondary | ICD-10-CM | POA: Diagnosis not present

## 2015-06-29 DIAGNOSIS — F328 Other depressive episodes: Secondary | ICD-10-CM | POA: Diagnosis not present

## 2015-08-11 DIAGNOSIS — J438 Other emphysema: Secondary | ICD-10-CM | POA: Diagnosis not present

## 2015-08-11 DIAGNOSIS — R300 Dysuria: Secondary | ICD-10-CM | POA: Diagnosis not present

## 2015-08-11 DIAGNOSIS — R32 Unspecified urinary incontinence: Secondary | ICD-10-CM | POA: Diagnosis not present

## 2015-08-11 DIAGNOSIS — M5430 Sciatica, unspecified side: Secondary | ICD-10-CM | POA: Diagnosis not present

## 2015-08-11 DIAGNOSIS — F328 Other depressive episodes: Secondary | ICD-10-CM | POA: Diagnosis not present

## 2015-08-11 DIAGNOSIS — G629 Polyneuropathy, unspecified: Secondary | ICD-10-CM | POA: Diagnosis not present

## 2015-08-11 DIAGNOSIS — M4850XA Collapsed vertebra, not elsewhere classified, site unspecified, initial encounter for fracture: Secondary | ICD-10-CM | POA: Diagnosis not present

## 2015-08-11 DIAGNOSIS — M129 Arthropathy, unspecified: Secondary | ICD-10-CM | POA: Diagnosis not present

## 2015-08-11 DIAGNOSIS — E039 Hypothyroidism, unspecified: Secondary | ICD-10-CM | POA: Diagnosis not present

## 2015-09-14 DIAGNOSIS — G629 Polyneuropathy, unspecified: Secondary | ICD-10-CM | POA: Diagnosis not present

## 2015-09-14 DIAGNOSIS — E039 Hypothyroidism, unspecified: Secondary | ICD-10-CM | POA: Diagnosis not present

## 2015-09-20 DIAGNOSIS — J438 Other emphysema: Secondary | ICD-10-CM | POA: Diagnosis not present

## 2015-09-20 DIAGNOSIS — N393 Stress incontinence (female) (male): Secondary | ICD-10-CM | POA: Diagnosis not present

## 2015-09-20 DIAGNOSIS — E039 Hypothyroidism, unspecified: Secondary | ICD-10-CM | POA: Diagnosis not present

## 2015-09-20 DIAGNOSIS — F330 Major depressive disorder, recurrent, mild: Secondary | ICD-10-CM | POA: Diagnosis not present

## 2015-09-20 DIAGNOSIS — M129 Arthropathy, unspecified: Secondary | ICD-10-CM | POA: Diagnosis not present

## 2015-09-20 DIAGNOSIS — G629 Polyneuropathy, unspecified: Secondary | ICD-10-CM | POA: Diagnosis not present

## 2015-10-18 ENCOUNTER — Encounter: Payer: Self-pay | Admitting: Cardiology

## 2015-10-18 ENCOUNTER — Ambulatory Visit (INDEPENDENT_AMBULATORY_CARE_PROVIDER_SITE_OTHER): Payer: Medicare Other | Admitting: Cardiology

## 2015-10-18 VITALS — BP 120/59 | HR 71 | Ht 65.0 in | Wt 127.2 lb

## 2015-10-18 DIAGNOSIS — I2510 Atherosclerotic heart disease of native coronary artery without angina pectoris: Secondary | ICD-10-CM | POA: Diagnosis not present

## 2015-10-18 DIAGNOSIS — I350 Nonrheumatic aortic (valve) stenosis: Secondary | ICD-10-CM | POA: Diagnosis not present

## 2015-10-18 DIAGNOSIS — I6523 Occlusion and stenosis of bilateral carotid arteries: Secondary | ICD-10-CM

## 2015-10-18 NOTE — Progress Notes (Signed)
Cardiology Office Note  Date: 10/18/2015   ID: Carmen Vaughn, Carmen Vaughn 14-Mar-1920, MRN [id number]  PCP: Rory Percy, MD  Primary Cardiologist: Rozann Lesches, MD   Chief Complaint  Patient presents with  . Coronary Artery Disease    History of Present Illness: Carmen Vaughn is a 81 y.o. female last seen in April 2016. She comes in for a routine cardiac visit. She still lives in her own home, reasonably functional even at advanced age. She does have a slow decline overall with chronic shortness of breath and lots of pain related to arthritis.  We have continued conservative management of CAD and valvular heart disease. She does not endorse any angina symptoms. She does have musculoskeletal arthritic pains in her thorax. Last ischemic testing was in 2013, we have not repeated an echocardiogram.   Past Medical History  Diagnosis Date  . Coronary atherosclerosis of native coronary artery     Multivessel status post CABG  . Essential hypertension, benign   . Mixed hyperlipidemia     Statin intolerant  . Mitral regurgitation     Moderate, 06/2012  . Aortic stenosis     Mild, 06/2012  . Hypothyroidism   . GERD (gastroesophageal reflux disease)     Past Surgical History  Procedure Laterality Date  . Coronary artery bypass graft      LIMA to LAD, SVG to diagonal    Current Outpatient Prescriptions  Medication Sig Dispense Refill  . albuterol (PROVENTIL) (2.5 MG/3ML) 0.083% nebulizer solution Take 2.5 mg by nebulization every 6 (six) hours as needed.      . Ascorbic Acid (VITAMIN C) 1000 MG tablet Take 1,000 mg by mouth daily.    . Biotin (BIOTIN 5000) 5 MG CAPS Take 2 capsules by mouth daily.    . Calcium Carb-Cholecalciferol (CALCIUM + D3) 600-200 MG-UNIT TABS Take 1 tablet by mouth 2 (two) times daily.     . diclofenac sodium (VOLTAREN) 1 % GEL Apply 1 application topically 4 (four) times daily as needed.    Marland Kitchen esomeprazole (NEXIUM) 40 MG capsule Take 40 mg by mouth daily  before breakfast.     . HYDROcodone-acetaminophen (NORCO/VICODIN) 5-325 MG per tablet Take 1.5 tablets by mouth 3 (three) times daily as needed.     Marland Kitchen levothyroxine (SYNTHROID, LEVOTHROID) 112 MCG tablet Take 112 mcg by mouth daily.      . methocarbamol (ROBAXIN) 500 MG tablet Take 250 mg by mouth at bedtime.    . Multiple Vitamins-Minerals (CENTRUM SILVER PO) Take 1 tablet by mouth daily.      Marland Kitchen PROAIR HFA 108 (90 BASE) MCG/ACT inhaler Inhale 2 puffs into the lungs 2 (two) times daily.    . raloxifene (EVISTA) 60 MG tablet Take 60 mg by mouth daily.       No current facility-administered medications for this visit.    Allergies:  Nitroglycerin; Penicillins; Statins; and Aleve   Social History: The patient  reports that she has been passively smoking Cigarettes.  She has never used smokeless tobacco. She reports that she does not drink alcohol or use illicit drugs.   ROS:  Please see the history of present illness. Otherwise, complete review of systems is positive for decreased hearing, arthritic pain and stiffness, uses a cane. She denies any falls..  All other systems are reviewed and negative.   Physical Exam: VS:  BP 120/59 mmHg  Pulse 71  Ht 5\' 5"  (1.651 m)  Wt 127 lb 3.2 oz (57.698 kg)  BMI 21.17 kg/m2  SpO2 98%, BMI Body mass index is 21.17 kg/(m^2).  Wt Readings from Last 3 Encounters:  10/18/15 127 lb 3.2 oz (57.698 kg)  03/15/15 125 lb (56.7 kg)  09/06/14 125 lb 12 oz (57.04 kg)     Elderly woman, appears comfortable.  HEENT: Conjunctiva normal, oropharynx clear.  Neck: Supple, no elevated JVP, soft carotid bruits, no thyromegaly.  Lungs: Clear to auscultation, nonlabored breathing at rest.  Cardiac: Regular rate and rhythm, no S3, 2/6 systolic murmur, no pericardial rub.  Abdomen: Soft, nontender, bowel sounds present.  Extremities: No pitting edema, distal pulses 1-2+.    ECG: Tracing from 03/15/2015 showed normal sinus rhythm with nonspecific T-wave  changes..  Other Studies Reviewed Today:  Lexiscan Cardiolite from August 2013 was negative for ischemia with LVEF 58%.  Assessment and Plan:   1. Multivessel CAD status post CABG. No active angina symptoms. She has chronic shortness of breath but remains reasonably active at advanced age. Last ischemic testing was in 2013. We will continue with conservative observation for now. Baby aspirin as tolerated. She is intolerant to statins.  2. Valvular heart disease including mitral regurgitation and aortic stenosis. Also following conservatively.  Current medicines were reviewed with the patient today.  Disposition: FU with me in 6 months.   Signed, Satira Sark, MD, [facility] 10/18/2015 1:18 PM    [facility] [place], [place], [place] [address] Phone: [phone]; Fax: [phone]

## 2015-10-18 NOTE — Patient Instructions (Signed)
Your physician recommends that you continue on your current medications as directed. Please refer to the Current Medication list given to you today. Your physician recommends that you schedule a follow-up appointment in: 6 months. You will receive a reminder letter in the mail in about 4 months reminding you to call and schedule your appointment. If you don't receive this letter, please contact our office. 

## 2015-12-21 DIAGNOSIS — D519 Vitamin B12 deficiency anemia, unspecified: Secondary | ICD-10-CM | POA: Diagnosis not present

## 2015-12-21 DIAGNOSIS — E039 Hypothyroidism, unspecified: Secondary | ICD-10-CM | POA: Diagnosis not present

## 2015-12-21 DIAGNOSIS — F3289 Other specified depressive episodes: Secondary | ICD-10-CM | POA: Diagnosis not present

## 2015-12-21 DIAGNOSIS — G629 Polyneuropathy, unspecified: Secondary | ICD-10-CM | POA: Diagnosis not present

## 2015-12-21 DIAGNOSIS — J438 Other emphysema: Secondary | ICD-10-CM | POA: Diagnosis not present

## 2015-12-28 DIAGNOSIS — E039 Hypothyroidism, unspecified: Secondary | ICD-10-CM | POA: Diagnosis not present

## 2015-12-28 DIAGNOSIS — G629 Polyneuropathy, unspecified: Secondary | ICD-10-CM | POA: Diagnosis not present

## 2015-12-28 DIAGNOSIS — M129 Arthropathy, unspecified: Secondary | ICD-10-CM | POA: Diagnosis not present

## 2015-12-28 DIAGNOSIS — N393 Stress incontinence (female) (male): Secondary | ICD-10-CM | POA: Diagnosis not present

## 2016-01-30 DIAGNOSIS — S20211A Contusion of right front wall of thorax, initial encounter: Secondary | ICD-10-CM | POA: Diagnosis not present

## 2016-02-14 DIAGNOSIS — M129 Arthropathy, unspecified: Secondary | ICD-10-CM | POA: Diagnosis not present

## 2016-02-14 DIAGNOSIS — M4850XA Collapsed vertebra, not elsewhere classified, site unspecified, initial encounter for fracture: Secondary | ICD-10-CM | POA: Diagnosis not present

## 2016-02-14 DIAGNOSIS — F330 Major depressive disorder, recurrent, mild: Secondary | ICD-10-CM | POA: Diagnosis not present

## 2016-02-14 DIAGNOSIS — E039 Hypothyroidism, unspecified: Secondary | ICD-10-CM | POA: Diagnosis not present

## 2016-02-14 DIAGNOSIS — J438 Other emphysema: Secondary | ICD-10-CM | POA: Diagnosis not present

## 2016-03-11 DIAGNOSIS — R0602 Shortness of breath: Secondary | ICD-10-CM | POA: Diagnosis not present

## 2016-03-11 DIAGNOSIS — I350 Nonrheumatic aortic (valve) stenosis: Secondary | ICD-10-CM | POA: Diagnosis not present

## 2016-03-14 DIAGNOSIS — I517 Cardiomegaly: Secondary | ICD-10-CM | POA: Diagnosis not present

## 2016-03-14 DIAGNOSIS — I4891 Unspecified atrial fibrillation: Secondary | ICD-10-CM | POA: Diagnosis not present

## 2016-03-14 DIAGNOSIS — I350 Nonrheumatic aortic (valve) stenosis: Secondary | ICD-10-CM | POA: Diagnosis not present

## 2016-03-14 DIAGNOSIS — R0602 Shortness of breath: Secondary | ICD-10-CM | POA: Diagnosis not present

## 2016-03-14 DIAGNOSIS — I340 Nonrheumatic mitral (valve) insufficiency: Secondary | ICD-10-CM | POA: Diagnosis not present

## 2016-03-14 DIAGNOSIS — I080 Rheumatic disorders of both mitral and aortic valves: Secondary | ICD-10-CM | POA: Diagnosis not present

## 2016-03-17 DIAGNOSIS — J45909 Unspecified asthma, uncomplicated: Secondary | ICD-10-CM | POA: Diagnosis not present

## 2016-03-17 DIAGNOSIS — E039 Hypothyroidism, unspecified: Secondary | ICD-10-CM | POA: Diagnosis not present

## 2016-03-17 DIAGNOSIS — S3992XA Unspecified injury of lower back, initial encounter: Secondary | ICD-10-CM | POA: Diagnosis not present

## 2016-03-17 DIAGNOSIS — S32049A Unspecified fracture of fourth lumbar vertebra, initial encounter for closed fracture: Secondary | ICD-10-CM | POA: Diagnosis not present

## 2016-03-17 DIAGNOSIS — Z955 Presence of coronary angioplasty implant and graft: Secondary | ICD-10-CM | POA: Diagnosis not present

## 2016-03-17 DIAGNOSIS — E7800 Pure hypercholesterolemia, unspecified: Secondary | ICD-10-CM | POA: Diagnosis not present

## 2016-03-17 DIAGNOSIS — K219 Gastro-esophageal reflux disease without esophagitis: Secondary | ICD-10-CM | POA: Diagnosis not present

## 2016-03-17 DIAGNOSIS — Z9109 Other allergy status, other than to drugs and biological substances: Secondary | ICD-10-CM | POA: Diagnosis not present

## 2016-03-17 DIAGNOSIS — R54 Age-related physical debility: Secondary | ICD-10-CM | POA: Diagnosis not present

## 2016-03-17 DIAGNOSIS — M4856XA Collapsed vertebra, not elsewhere classified, lumbar region, initial encounter for fracture: Secondary | ICD-10-CM | POA: Diagnosis not present

## 2016-03-17 DIAGNOSIS — Z951 Presence of aortocoronary bypass graft: Secondary | ICD-10-CM | POA: Diagnosis not present

## 2016-03-17 DIAGNOSIS — M4806 Spinal stenosis, lumbar region: Secondary | ICD-10-CM | POA: Diagnosis not present

## 2016-03-17 DIAGNOSIS — G894 Chronic pain syndrome: Secondary | ICD-10-CM | POA: Diagnosis not present

## 2016-03-17 DIAGNOSIS — I2510 Atherosclerotic heart disease of native coronary artery without angina pectoris: Secondary | ICD-10-CM | POA: Diagnosis not present

## 2016-03-17 DIAGNOSIS — Z79899 Other long term (current) drug therapy: Secondary | ICD-10-CM | POA: Diagnosis not present

## 2016-03-17 DIAGNOSIS — J449 Chronic obstructive pulmonary disease, unspecified: Secondary | ICD-10-CM | POA: Diagnosis not present

## 2016-03-17 DIAGNOSIS — Z880 Allergy status to penicillin: Secondary | ICD-10-CM | POA: Diagnosis not present

## 2016-03-17 DIAGNOSIS — M545 Low back pain: Secondary | ICD-10-CM | POA: Diagnosis not present

## 2016-03-18 DIAGNOSIS — S32049A Unspecified fracture of fourth lumbar vertebra, initial encounter for closed fracture: Secondary | ICD-10-CM | POA: Diagnosis not present

## 2016-03-18 DIAGNOSIS — J90 Pleural effusion, not elsewhere classified: Secondary | ICD-10-CM | POA: Diagnosis not present

## 2016-03-18 DIAGNOSIS — S299XXA Unspecified injury of thorax, initial encounter: Secondary | ICD-10-CM | POA: Diagnosis not present

## 2016-03-18 DIAGNOSIS — S32040A Wedge compression fracture of fourth lumbar vertebra, initial encounter for closed fracture: Secondary | ICD-10-CM | POA: Diagnosis not present

## 2016-03-18 DIAGNOSIS — M546 Pain in thoracic spine: Secondary | ICD-10-CM | POA: Diagnosis not present

## 2016-03-19 DIAGNOSIS — S32049A Unspecified fracture of fourth lumbar vertebra, initial encounter for closed fracture: Secondary | ICD-10-CM | POA: Diagnosis not present

## 2016-03-19 DIAGNOSIS — W1830XA Fall on same level, unspecified, initial encounter: Secondary | ICD-10-CM | POA: Diagnosis not present

## 2016-03-19 DIAGNOSIS — M4806 Spinal stenosis, lumbar region: Secondary | ICD-10-CM | POA: Diagnosis not present

## 2016-03-20 DIAGNOSIS — S8992XA Unspecified injury of left lower leg, initial encounter: Secondary | ICD-10-CM | POA: Diagnosis not present

## 2016-03-20 DIAGNOSIS — M25562 Pain in left knee: Secondary | ICD-10-CM | POA: Diagnosis not present

## 2016-03-20 DIAGNOSIS — S79912A Unspecified injury of left hip, initial encounter: Secondary | ICD-10-CM | POA: Diagnosis not present

## 2016-03-20 DIAGNOSIS — S32049A Unspecified fracture of fourth lumbar vertebra, initial encounter for closed fracture: Secondary | ICD-10-CM | POA: Diagnosis not present

## 2016-03-20 DIAGNOSIS — M79605 Pain in left leg: Secondary | ICD-10-CM | POA: Diagnosis not present

## 2016-03-20 DIAGNOSIS — W1830XA Fall on same level, unspecified, initial encounter: Secondary | ICD-10-CM | POA: Diagnosis not present

## 2016-03-20 DIAGNOSIS — M4806 Spinal stenosis, lumbar region: Secondary | ICD-10-CM | POA: Diagnosis not present

## 2016-03-20 DIAGNOSIS — M25552 Pain in left hip: Secondary | ICD-10-CM | POA: Diagnosis not present

## 2016-03-21 DIAGNOSIS — W19XXXD Unspecified fall, subsequent encounter: Secondary | ICD-10-CM | POA: Diagnosis not present

## 2016-03-21 DIAGNOSIS — E785 Hyperlipidemia, unspecified: Secondary | ICD-10-CM | POA: Diagnosis not present

## 2016-03-21 DIAGNOSIS — J449 Chronic obstructive pulmonary disease, unspecified: Secondary | ICD-10-CM | POA: Diagnosis not present

## 2016-03-21 DIAGNOSIS — K219 Gastro-esophageal reflux disease without esophagitis: Secondary | ICD-10-CM | POA: Diagnosis not present

## 2016-03-21 DIAGNOSIS — E039 Hypothyroidism, unspecified: Secondary | ICD-10-CM | POA: Diagnosis not present

## 2016-03-21 DIAGNOSIS — S32040D Wedge compression fracture of fourth lumbar vertebra, subsequent encounter for fracture with routine healing: Secondary | ICD-10-CM | POA: Diagnosis not present

## 2016-03-22 DIAGNOSIS — E039 Hypothyroidism, unspecified: Secondary | ICD-10-CM | POA: Diagnosis not present

## 2016-03-22 DIAGNOSIS — S32040D Wedge compression fracture of fourth lumbar vertebra, subsequent encounter for fracture with routine healing: Secondary | ICD-10-CM | POA: Diagnosis not present

## 2016-03-22 DIAGNOSIS — W19XXXD Unspecified fall, subsequent encounter: Secondary | ICD-10-CM | POA: Diagnosis not present

## 2016-03-22 DIAGNOSIS — K219 Gastro-esophageal reflux disease without esophagitis: Secondary | ICD-10-CM | POA: Diagnosis not present

## 2016-03-22 DIAGNOSIS — J449 Chronic obstructive pulmonary disease, unspecified: Secondary | ICD-10-CM | POA: Diagnosis not present

## 2016-03-22 DIAGNOSIS — E785 Hyperlipidemia, unspecified: Secondary | ICD-10-CM | POA: Diagnosis not present

## 2016-03-26 DIAGNOSIS — K219 Gastro-esophageal reflux disease without esophagitis: Secondary | ICD-10-CM | POA: Diagnosis not present

## 2016-03-26 DIAGNOSIS — E039 Hypothyroidism, unspecified: Secondary | ICD-10-CM | POA: Diagnosis not present

## 2016-03-26 DIAGNOSIS — J449 Chronic obstructive pulmonary disease, unspecified: Secondary | ICD-10-CM | POA: Diagnosis not present

## 2016-03-26 DIAGNOSIS — W19XXXD Unspecified fall, subsequent encounter: Secondary | ICD-10-CM | POA: Diagnosis not present

## 2016-03-26 DIAGNOSIS — E785 Hyperlipidemia, unspecified: Secondary | ICD-10-CM | POA: Diagnosis not present

## 2016-03-26 DIAGNOSIS — S32040D Wedge compression fracture of fourth lumbar vertebra, subsequent encounter for fracture with routine healing: Secondary | ICD-10-CM | POA: Diagnosis not present

## 2016-03-27 DIAGNOSIS — S32000A Wedge compression fracture of unspecified lumbar vertebra, initial encounter for closed fracture: Secondary | ICD-10-CM | POA: Diagnosis not present

## 2016-03-28 DIAGNOSIS — K219 Gastro-esophageal reflux disease without esophagitis: Secondary | ICD-10-CM | POA: Diagnosis not present

## 2016-03-28 DIAGNOSIS — Z951 Presence of aortocoronary bypass graft: Secondary | ICD-10-CM | POA: Diagnosis not present

## 2016-03-28 DIAGNOSIS — J449 Chronic obstructive pulmonary disease, unspecified: Secondary | ICD-10-CM | POA: Diagnosis not present

## 2016-03-28 DIAGNOSIS — F329 Major depressive disorder, single episode, unspecified: Secondary | ICD-10-CM | POA: Diagnosis not present

## 2016-03-28 DIAGNOSIS — S32040D Wedge compression fracture of fourth lumbar vertebra, subsequent encounter for fracture with routine healing: Secondary | ICD-10-CM | POA: Diagnosis not present

## 2016-03-28 DIAGNOSIS — E039 Hypothyroidism, unspecified: Secondary | ICD-10-CM | POA: Diagnosis not present

## 2016-03-28 DIAGNOSIS — Z79899 Other long term (current) drug therapy: Secondary | ICD-10-CM | POA: Diagnosis not present

## 2016-03-28 DIAGNOSIS — S32040A Wedge compression fracture of fourth lumbar vertebra, initial encounter for closed fracture: Secondary | ICD-10-CM | POA: Diagnosis not present

## 2016-03-28 DIAGNOSIS — Z9889 Other specified postprocedural states: Secondary | ICD-10-CM | POA: Diagnosis not present

## 2016-03-28 DIAGNOSIS — M8088XA Other osteoporosis with current pathological fracture, vertebra(e), initial encounter for fracture: Secondary | ICD-10-CM | POA: Diagnosis not present

## 2016-03-28 DIAGNOSIS — E785 Hyperlipidemia, unspecified: Secondary | ICD-10-CM | POA: Diagnosis not present

## 2016-03-28 DIAGNOSIS — J45909 Unspecified asthma, uncomplicated: Secondary | ICD-10-CM | POA: Diagnosis not present

## 2016-03-28 DIAGNOSIS — W19XXXD Unspecified fall, subsequent encounter: Secondary | ICD-10-CM | POA: Diagnosis not present

## 2016-03-29 DIAGNOSIS — F329 Major depressive disorder, single episode, unspecified: Secondary | ICD-10-CM | POA: Diagnosis not present

## 2016-03-29 DIAGNOSIS — J449 Chronic obstructive pulmonary disease, unspecified: Secondary | ICD-10-CM | POA: Diagnosis not present

## 2016-03-29 DIAGNOSIS — Z951 Presence of aortocoronary bypass graft: Secondary | ICD-10-CM | POA: Diagnosis not present

## 2016-03-29 DIAGNOSIS — Z79899 Other long term (current) drug therapy: Secondary | ICD-10-CM | POA: Diagnosis not present

## 2016-03-29 DIAGNOSIS — M8088XA Other osteoporosis with current pathological fracture, vertebra(e), initial encounter for fracture: Secondary | ICD-10-CM | POA: Diagnosis not present

## 2016-03-30 DIAGNOSIS — Z79899 Other long term (current) drug therapy: Secondary | ICD-10-CM | POA: Diagnosis not present

## 2016-03-30 DIAGNOSIS — F329 Major depressive disorder, single episode, unspecified: Secondary | ICD-10-CM | POA: Diagnosis not present

## 2016-03-30 DIAGNOSIS — Z951 Presence of aortocoronary bypass graft: Secondary | ICD-10-CM | POA: Diagnosis not present

## 2016-03-30 DIAGNOSIS — M8088XA Other osteoporosis with current pathological fracture, vertebra(e), initial encounter for fracture: Secondary | ICD-10-CM | POA: Diagnosis not present

## 2016-03-30 DIAGNOSIS — J449 Chronic obstructive pulmonary disease, unspecified: Secondary | ICD-10-CM | POA: Diagnosis not present

## 2016-04-01 DIAGNOSIS — W19XXXD Unspecified fall, subsequent encounter: Secondary | ICD-10-CM | POA: Diagnosis not present

## 2016-04-01 DIAGNOSIS — K219 Gastro-esophageal reflux disease without esophagitis: Secondary | ICD-10-CM | POA: Diagnosis not present

## 2016-04-01 DIAGNOSIS — S32040D Wedge compression fracture of fourth lumbar vertebra, subsequent encounter for fracture with routine healing: Secondary | ICD-10-CM | POA: Diagnosis not present

## 2016-04-01 DIAGNOSIS — J449 Chronic obstructive pulmonary disease, unspecified: Secondary | ICD-10-CM | POA: Diagnosis not present

## 2016-04-01 DIAGNOSIS — E785 Hyperlipidemia, unspecified: Secondary | ICD-10-CM | POA: Diagnosis not present

## 2016-04-01 DIAGNOSIS — E039 Hypothyroidism, unspecified: Secondary | ICD-10-CM | POA: Diagnosis not present

## 2016-04-02 DIAGNOSIS — E039 Hypothyroidism, unspecified: Secondary | ICD-10-CM | POA: Diagnosis not present

## 2016-04-02 DIAGNOSIS — W19XXXD Unspecified fall, subsequent encounter: Secondary | ICD-10-CM | POA: Diagnosis not present

## 2016-04-02 DIAGNOSIS — J449 Chronic obstructive pulmonary disease, unspecified: Secondary | ICD-10-CM | POA: Diagnosis not present

## 2016-04-02 DIAGNOSIS — S32040D Wedge compression fracture of fourth lumbar vertebra, subsequent encounter for fracture with routine healing: Secondary | ICD-10-CM | POA: Diagnosis not present

## 2016-04-02 DIAGNOSIS — E785 Hyperlipidemia, unspecified: Secondary | ICD-10-CM | POA: Diagnosis not present

## 2016-04-02 DIAGNOSIS — K219 Gastro-esophageal reflux disease without esophagitis: Secondary | ICD-10-CM | POA: Diagnosis not present

## 2016-04-04 DIAGNOSIS — J449 Chronic obstructive pulmonary disease, unspecified: Secondary | ICD-10-CM | POA: Diagnosis not present

## 2016-04-04 DIAGNOSIS — E785 Hyperlipidemia, unspecified: Secondary | ICD-10-CM | POA: Diagnosis not present

## 2016-04-04 DIAGNOSIS — W19XXXD Unspecified fall, subsequent encounter: Secondary | ICD-10-CM | POA: Diagnosis not present

## 2016-04-04 DIAGNOSIS — K219 Gastro-esophageal reflux disease without esophagitis: Secondary | ICD-10-CM | POA: Diagnosis not present

## 2016-04-04 DIAGNOSIS — E039 Hypothyroidism, unspecified: Secondary | ICD-10-CM | POA: Diagnosis not present

## 2016-04-04 DIAGNOSIS — S32040D Wedge compression fracture of fourth lumbar vertebra, subsequent encounter for fracture with routine healing: Secondary | ICD-10-CM | POA: Diagnosis not present

## 2016-04-05 DIAGNOSIS — E039 Hypothyroidism, unspecified: Secondary | ICD-10-CM | POA: Diagnosis not present

## 2016-04-05 DIAGNOSIS — E785 Hyperlipidemia, unspecified: Secondary | ICD-10-CM | POA: Diagnosis not present

## 2016-04-05 DIAGNOSIS — J449 Chronic obstructive pulmonary disease, unspecified: Secondary | ICD-10-CM | POA: Diagnosis not present

## 2016-04-05 DIAGNOSIS — W19XXXD Unspecified fall, subsequent encounter: Secondary | ICD-10-CM | POA: Diagnosis not present

## 2016-04-05 DIAGNOSIS — K219 Gastro-esophageal reflux disease without esophagitis: Secondary | ICD-10-CM | POA: Diagnosis not present

## 2016-04-05 DIAGNOSIS — S32040D Wedge compression fracture of fourth lumbar vertebra, subsequent encounter for fracture with routine healing: Secondary | ICD-10-CM | POA: Diagnosis not present

## 2016-04-08 DIAGNOSIS — E039 Hypothyroidism, unspecified: Secondary | ICD-10-CM | POA: Diagnosis not present

## 2016-04-08 DIAGNOSIS — S32040D Wedge compression fracture of fourth lumbar vertebra, subsequent encounter for fracture with routine healing: Secondary | ICD-10-CM | POA: Diagnosis not present

## 2016-04-08 DIAGNOSIS — J449 Chronic obstructive pulmonary disease, unspecified: Secondary | ICD-10-CM | POA: Diagnosis not present

## 2016-04-08 DIAGNOSIS — W19XXXD Unspecified fall, subsequent encounter: Secondary | ICD-10-CM | POA: Diagnosis not present

## 2016-04-08 DIAGNOSIS — K219 Gastro-esophageal reflux disease without esophagitis: Secondary | ICD-10-CM | POA: Diagnosis not present

## 2016-04-08 DIAGNOSIS — E785 Hyperlipidemia, unspecified: Secondary | ICD-10-CM | POA: Diagnosis not present

## 2016-04-09 DIAGNOSIS — J449 Chronic obstructive pulmonary disease, unspecified: Secondary | ICD-10-CM | POA: Diagnosis not present

## 2016-04-09 DIAGNOSIS — E039 Hypothyroidism, unspecified: Secondary | ICD-10-CM | POA: Diagnosis not present

## 2016-04-09 DIAGNOSIS — S32040D Wedge compression fracture of fourth lumbar vertebra, subsequent encounter for fracture with routine healing: Secondary | ICD-10-CM | POA: Diagnosis not present

## 2016-04-09 DIAGNOSIS — W19XXXD Unspecified fall, subsequent encounter: Secondary | ICD-10-CM | POA: Diagnosis not present

## 2016-04-09 DIAGNOSIS — K219 Gastro-esophageal reflux disease without esophagitis: Secondary | ICD-10-CM | POA: Diagnosis not present

## 2016-04-09 DIAGNOSIS — E785 Hyperlipidemia, unspecified: Secondary | ICD-10-CM | POA: Diagnosis not present

## 2016-04-11 DIAGNOSIS — E785 Hyperlipidemia, unspecified: Secondary | ICD-10-CM | POA: Diagnosis not present

## 2016-04-11 DIAGNOSIS — E039 Hypothyroidism, unspecified: Secondary | ICD-10-CM | POA: Diagnosis not present

## 2016-04-11 DIAGNOSIS — K219 Gastro-esophageal reflux disease without esophagitis: Secondary | ICD-10-CM | POA: Diagnosis not present

## 2016-04-11 DIAGNOSIS — W19XXXD Unspecified fall, subsequent encounter: Secondary | ICD-10-CM | POA: Diagnosis not present

## 2016-04-11 DIAGNOSIS — S32040D Wedge compression fracture of fourth lumbar vertebra, subsequent encounter for fracture with routine healing: Secondary | ICD-10-CM | POA: Diagnosis not present

## 2016-04-11 DIAGNOSIS — J449 Chronic obstructive pulmonary disease, unspecified: Secondary | ICD-10-CM | POA: Diagnosis not present

## 2016-04-24 DIAGNOSIS — S32040D Wedge compression fracture of fourth lumbar vertebra, subsequent encounter for fracture with routine healing: Secondary | ICD-10-CM | POA: Diagnosis not present

## 2016-04-24 DIAGNOSIS — E785 Hyperlipidemia, unspecified: Secondary | ICD-10-CM | POA: Diagnosis not present

## 2016-04-24 DIAGNOSIS — E039 Hypothyroidism, unspecified: Secondary | ICD-10-CM | POA: Diagnosis not present

## 2016-04-24 DIAGNOSIS — K219 Gastro-esophageal reflux disease without esophagitis: Secondary | ICD-10-CM | POA: Diagnosis not present

## 2016-04-24 DIAGNOSIS — W19XXXD Unspecified fall, subsequent encounter: Secondary | ICD-10-CM | POA: Diagnosis not present

## 2016-04-24 DIAGNOSIS — J449 Chronic obstructive pulmonary disease, unspecified: Secondary | ICD-10-CM | POA: Diagnosis not present

## 2016-04-28 DIAGNOSIS — B9620 Unspecified Escherichia coli [E. coli] as the cause of diseases classified elsewhere: Secondary | ICD-10-CM | POA: Diagnosis not present

## 2016-04-28 DIAGNOSIS — M79605 Pain in left leg: Secondary | ICD-10-CM | POA: Diagnosis not present

## 2016-04-28 DIAGNOSIS — M546 Pain in thoracic spine: Secondary | ICD-10-CM | POA: Diagnosis not present

## 2016-04-28 DIAGNOSIS — J441 Chronic obstructive pulmonary disease with (acute) exacerbation: Secondary | ICD-10-CM | POA: Diagnosis not present

## 2016-04-28 DIAGNOSIS — K5909 Other constipation: Secondary | ICD-10-CM | POA: Diagnosis not present

## 2016-04-28 DIAGNOSIS — M545 Low back pain: Secondary | ICD-10-CM | POA: Diagnosis not present

## 2016-04-28 DIAGNOSIS — K219 Gastro-esophageal reflux disease without esophagitis: Secondary | ICD-10-CM | POA: Diagnosis not present

## 2016-04-28 DIAGNOSIS — M549 Dorsalgia, unspecified: Secondary | ICD-10-CM | POA: Diagnosis not present

## 2016-04-28 DIAGNOSIS — M533 Sacrococcygeal disorders, not elsewhere classified: Secondary | ICD-10-CM | POA: Diagnosis not present

## 2016-04-28 DIAGNOSIS — R52 Pain, unspecified: Secondary | ICD-10-CM | POA: Diagnosis not present

## 2016-04-28 DIAGNOSIS — N390 Urinary tract infection, site not specified: Secondary | ICD-10-CM | POA: Diagnosis not present

## 2016-04-28 DIAGNOSIS — M8008XA Age-related osteoporosis with current pathological fracture, vertebra(e), initial encounter for fracture: Secondary | ICD-10-CM | POA: Diagnosis not present

## 2016-04-29 DIAGNOSIS — M545 Low back pain: Secondary | ICD-10-CM | POA: Diagnosis not present

## 2016-04-30 DIAGNOSIS — M545 Low back pain: Secondary | ICD-10-CM | POA: Diagnosis not present

## 2016-05-01 DIAGNOSIS — I482 Chronic atrial fibrillation: Secondary | ICD-10-CM | POA: Diagnosis present

## 2016-05-01 DIAGNOSIS — Z79899 Other long term (current) drug therapy: Secondary | ICD-10-CM | POA: Diagnosis not present

## 2016-05-01 DIAGNOSIS — F419 Anxiety disorder, unspecified: Secondary | ICD-10-CM | POA: Diagnosis present

## 2016-05-01 DIAGNOSIS — K219 Gastro-esophageal reflux disease without esophagitis: Secondary | ICD-10-CM | POA: Diagnosis present

## 2016-05-01 DIAGNOSIS — N390 Urinary tract infection, site not specified: Secondary | ICD-10-CM | POA: Diagnosis present

## 2016-05-01 DIAGNOSIS — E039 Hypothyroidism, unspecified: Secondary | ICD-10-CM | POA: Diagnosis present

## 2016-05-01 DIAGNOSIS — M545 Low back pain: Secondary | ICD-10-CM | POA: Diagnosis not present

## 2016-05-01 DIAGNOSIS — M8008XA Age-related osteoporosis with current pathological fracture, vertebra(e), initial encounter for fracture: Secondary | ICD-10-CM | POA: Diagnosis present

## 2016-05-01 DIAGNOSIS — Z880 Allergy status to penicillin: Secondary | ICD-10-CM | POA: Diagnosis not present

## 2016-05-01 DIAGNOSIS — Z888 Allergy status to other drugs, medicaments and biological substances status: Secondary | ICD-10-CM | POA: Diagnosis not present

## 2016-05-01 DIAGNOSIS — E7800 Pure hypercholesterolemia, unspecified: Secondary | ICD-10-CM | POA: Diagnosis present

## 2016-05-01 DIAGNOSIS — F329 Major depressive disorder, single episode, unspecified: Secondary | ICD-10-CM | POA: Diagnosis present

## 2016-05-01 DIAGNOSIS — I2510 Atherosclerotic heart disease of native coronary artery without angina pectoris: Secondary | ICD-10-CM | POA: Diagnosis present

## 2016-05-01 DIAGNOSIS — K5909 Other constipation: Secondary | ICD-10-CM | POA: Diagnosis present

## 2016-05-01 DIAGNOSIS — Z951 Presence of aortocoronary bypass graft: Secondary | ICD-10-CM | POA: Diagnosis not present

## 2016-05-01 DIAGNOSIS — J441 Chronic obstructive pulmonary disease with (acute) exacerbation: Secondary | ICD-10-CM | POA: Diagnosis not present

## 2016-05-01 DIAGNOSIS — B9620 Unspecified Escherichia coli [E. coli] as the cause of diseases classified elsewhere: Secondary | ICD-10-CM | POA: Diagnosis present

## 2016-05-04 DIAGNOSIS — J449 Chronic obstructive pulmonary disease, unspecified: Secondary | ICD-10-CM | POA: Diagnosis not present

## 2016-05-04 DIAGNOSIS — E785 Hyperlipidemia, unspecified: Secondary | ICD-10-CM | POA: Diagnosis not present

## 2016-05-04 DIAGNOSIS — W19XXXD Unspecified fall, subsequent encounter: Secondary | ICD-10-CM | POA: Diagnosis not present

## 2016-05-04 DIAGNOSIS — E039 Hypothyroidism, unspecified: Secondary | ICD-10-CM | POA: Diagnosis not present

## 2016-05-04 DIAGNOSIS — S32040D Wedge compression fracture of fourth lumbar vertebra, subsequent encounter for fracture with routine healing: Secondary | ICD-10-CM | POA: Diagnosis not present

## 2016-05-04 DIAGNOSIS — K219 Gastro-esophageal reflux disease without esophagitis: Secondary | ICD-10-CM | POA: Diagnosis not present

## 2016-05-06 DIAGNOSIS — E039 Hypothyroidism, unspecified: Secondary | ICD-10-CM | POA: Diagnosis not present

## 2016-05-06 DIAGNOSIS — J449 Chronic obstructive pulmonary disease, unspecified: Secondary | ICD-10-CM | POA: Diagnosis not present

## 2016-05-06 DIAGNOSIS — S32040D Wedge compression fracture of fourth lumbar vertebra, subsequent encounter for fracture with routine healing: Secondary | ICD-10-CM | POA: Diagnosis not present

## 2016-05-06 DIAGNOSIS — E785 Hyperlipidemia, unspecified: Secondary | ICD-10-CM | POA: Diagnosis not present

## 2016-05-06 DIAGNOSIS — W19XXXD Unspecified fall, subsequent encounter: Secondary | ICD-10-CM | POA: Diagnosis not present

## 2016-05-06 DIAGNOSIS — K219 Gastro-esophageal reflux disease without esophagitis: Secondary | ICD-10-CM | POA: Diagnosis not present

## 2016-05-07 DIAGNOSIS — E785 Hyperlipidemia, unspecified: Secondary | ICD-10-CM | POA: Diagnosis not present

## 2016-05-07 DIAGNOSIS — J449 Chronic obstructive pulmonary disease, unspecified: Secondary | ICD-10-CM | POA: Diagnosis not present

## 2016-05-07 DIAGNOSIS — K219 Gastro-esophageal reflux disease without esophagitis: Secondary | ICD-10-CM | POA: Diagnosis not present

## 2016-05-07 DIAGNOSIS — W19XXXD Unspecified fall, subsequent encounter: Secondary | ICD-10-CM | POA: Diagnosis not present

## 2016-05-07 DIAGNOSIS — S32040D Wedge compression fracture of fourth lumbar vertebra, subsequent encounter for fracture with routine healing: Secondary | ICD-10-CM | POA: Diagnosis not present

## 2016-05-07 DIAGNOSIS — E039 Hypothyroidism, unspecified: Secondary | ICD-10-CM | POA: Diagnosis not present

## 2016-05-09 DIAGNOSIS — S32040D Wedge compression fracture of fourth lumbar vertebra, subsequent encounter for fracture with routine healing: Secondary | ICD-10-CM | POA: Diagnosis not present

## 2016-05-09 DIAGNOSIS — K219 Gastro-esophageal reflux disease without esophagitis: Secondary | ICD-10-CM | POA: Diagnosis not present

## 2016-05-09 DIAGNOSIS — W19XXXD Unspecified fall, subsequent encounter: Secondary | ICD-10-CM | POA: Diagnosis not present

## 2016-05-09 DIAGNOSIS — J449 Chronic obstructive pulmonary disease, unspecified: Secondary | ICD-10-CM | POA: Diagnosis not present

## 2016-05-09 DIAGNOSIS — E039 Hypothyroidism, unspecified: Secondary | ICD-10-CM | POA: Diagnosis not present

## 2016-05-09 DIAGNOSIS — E785 Hyperlipidemia, unspecified: Secondary | ICD-10-CM | POA: Diagnosis not present

## 2016-05-13 DIAGNOSIS — S32040D Wedge compression fracture of fourth lumbar vertebra, subsequent encounter for fracture with routine healing: Secondary | ICD-10-CM | POA: Diagnosis not present

## 2016-05-13 DIAGNOSIS — K219 Gastro-esophageal reflux disease without esophagitis: Secondary | ICD-10-CM | POA: Diagnosis not present

## 2016-05-13 DIAGNOSIS — E785 Hyperlipidemia, unspecified: Secondary | ICD-10-CM | POA: Diagnosis not present

## 2016-05-13 DIAGNOSIS — W19XXXD Unspecified fall, subsequent encounter: Secondary | ICD-10-CM | POA: Diagnosis not present

## 2016-05-13 DIAGNOSIS — J449 Chronic obstructive pulmonary disease, unspecified: Secondary | ICD-10-CM | POA: Diagnosis not present

## 2016-05-13 DIAGNOSIS — E039 Hypothyroidism, unspecified: Secondary | ICD-10-CM | POA: Diagnosis not present

## 2016-05-14 DIAGNOSIS — G629 Polyneuropathy, unspecified: Secondary | ICD-10-CM | POA: Diagnosis not present

## 2016-05-14 DIAGNOSIS — N393 Stress incontinence (female) (male): Secondary | ICD-10-CM | POA: Diagnosis not present

## 2016-05-14 DIAGNOSIS — E039 Hypothyroidism, unspecified: Secondary | ICD-10-CM | POA: Diagnosis not present

## 2016-05-14 DIAGNOSIS — M4850XA Collapsed vertebra, not elsewhere classified, site unspecified, initial encounter for fracture: Secondary | ICD-10-CM | POA: Diagnosis not present

## 2016-05-15 DIAGNOSIS — S32040D Wedge compression fracture of fourth lumbar vertebra, subsequent encounter for fracture with routine healing: Secondary | ICD-10-CM | POA: Diagnosis not present

## 2016-05-15 DIAGNOSIS — W19XXXD Unspecified fall, subsequent encounter: Secondary | ICD-10-CM | POA: Diagnosis not present

## 2016-05-15 DIAGNOSIS — K219 Gastro-esophageal reflux disease without esophagitis: Secondary | ICD-10-CM | POA: Diagnosis not present

## 2016-05-15 DIAGNOSIS — E039 Hypothyroidism, unspecified: Secondary | ICD-10-CM | POA: Diagnosis not present

## 2016-05-15 DIAGNOSIS — E785 Hyperlipidemia, unspecified: Secondary | ICD-10-CM | POA: Diagnosis not present

## 2016-05-15 DIAGNOSIS — J449 Chronic obstructive pulmonary disease, unspecified: Secondary | ICD-10-CM | POA: Diagnosis not present

## 2016-05-16 DIAGNOSIS — S32040D Wedge compression fracture of fourth lumbar vertebra, subsequent encounter for fracture with routine healing: Secondary | ICD-10-CM | POA: Diagnosis not present

## 2016-05-16 DIAGNOSIS — E039 Hypothyroidism, unspecified: Secondary | ICD-10-CM | POA: Diagnosis not present

## 2016-05-16 DIAGNOSIS — E785 Hyperlipidemia, unspecified: Secondary | ICD-10-CM | POA: Diagnosis not present

## 2016-05-16 DIAGNOSIS — W19XXXD Unspecified fall, subsequent encounter: Secondary | ICD-10-CM | POA: Diagnosis not present

## 2016-05-16 DIAGNOSIS — K219 Gastro-esophageal reflux disease without esophagitis: Secondary | ICD-10-CM | POA: Diagnosis not present

## 2016-05-16 DIAGNOSIS — J449 Chronic obstructive pulmonary disease, unspecified: Secondary | ICD-10-CM | POA: Diagnosis not present

## 2016-05-17 DIAGNOSIS — K219 Gastro-esophageal reflux disease without esophagitis: Secondary | ICD-10-CM | POA: Diagnosis not present

## 2016-05-17 DIAGNOSIS — S32040D Wedge compression fracture of fourth lumbar vertebra, subsequent encounter for fracture with routine healing: Secondary | ICD-10-CM | POA: Diagnosis not present

## 2016-05-17 DIAGNOSIS — E039 Hypothyroidism, unspecified: Secondary | ICD-10-CM | POA: Diagnosis not present

## 2016-05-17 DIAGNOSIS — W19XXXD Unspecified fall, subsequent encounter: Secondary | ICD-10-CM | POA: Diagnosis not present

## 2016-05-17 DIAGNOSIS — J449 Chronic obstructive pulmonary disease, unspecified: Secondary | ICD-10-CM | POA: Diagnosis not present

## 2016-05-17 DIAGNOSIS — E785 Hyperlipidemia, unspecified: Secondary | ICD-10-CM | POA: Diagnosis not present

## 2016-05-20 DIAGNOSIS — E785 Hyperlipidemia, unspecified: Secondary | ICD-10-CM | POA: Diagnosis not present

## 2016-05-20 DIAGNOSIS — W19XXXD Unspecified fall, subsequent encounter: Secondary | ICD-10-CM | POA: Diagnosis not present

## 2016-05-20 DIAGNOSIS — S3210XD Unspecified fracture of sacrum, subsequent encounter for fracture with routine healing: Secondary | ICD-10-CM | POA: Diagnosis not present

## 2016-05-20 DIAGNOSIS — J449 Chronic obstructive pulmonary disease, unspecified: Secondary | ICD-10-CM | POA: Diagnosis not present

## 2016-05-20 DIAGNOSIS — E039 Hypothyroidism, unspecified: Secondary | ICD-10-CM | POA: Diagnosis not present

## 2016-05-20 DIAGNOSIS — K219 Gastro-esophageal reflux disease without esophagitis: Secondary | ICD-10-CM | POA: Diagnosis not present

## 2016-05-20 DIAGNOSIS — S32040D Wedge compression fracture of fourth lumbar vertebra, subsequent encounter for fracture with routine healing: Secondary | ICD-10-CM | POA: Diagnosis not present

## 2016-05-20 DIAGNOSIS — M810 Age-related osteoporosis without current pathological fracture: Secondary | ICD-10-CM | POA: Diagnosis not present

## 2016-05-21 DIAGNOSIS — J449 Chronic obstructive pulmonary disease, unspecified: Secondary | ICD-10-CM | POA: Diagnosis not present

## 2016-05-21 DIAGNOSIS — E785 Hyperlipidemia, unspecified: Secondary | ICD-10-CM | POA: Diagnosis not present

## 2016-05-21 DIAGNOSIS — E039 Hypothyroidism, unspecified: Secondary | ICD-10-CM | POA: Diagnosis not present

## 2016-05-21 DIAGNOSIS — S3210XD Unspecified fracture of sacrum, subsequent encounter for fracture with routine healing: Secondary | ICD-10-CM | POA: Diagnosis not present

## 2016-05-21 DIAGNOSIS — M810 Age-related osteoporosis without current pathological fracture: Secondary | ICD-10-CM | POA: Diagnosis not present

## 2016-05-21 DIAGNOSIS — S32040D Wedge compression fracture of fourth lumbar vertebra, subsequent encounter for fracture with routine healing: Secondary | ICD-10-CM | POA: Diagnosis not present

## 2016-05-22 DIAGNOSIS — E039 Hypothyroidism, unspecified: Secondary | ICD-10-CM | POA: Diagnosis not present

## 2016-05-22 DIAGNOSIS — S3210XD Unspecified fracture of sacrum, subsequent encounter for fracture with routine healing: Secondary | ICD-10-CM | POA: Diagnosis not present

## 2016-05-22 DIAGNOSIS — J449 Chronic obstructive pulmonary disease, unspecified: Secondary | ICD-10-CM | POA: Diagnosis not present

## 2016-05-22 DIAGNOSIS — E785 Hyperlipidemia, unspecified: Secondary | ICD-10-CM | POA: Diagnosis not present

## 2016-05-22 DIAGNOSIS — S32040D Wedge compression fracture of fourth lumbar vertebra, subsequent encounter for fracture with routine healing: Secondary | ICD-10-CM | POA: Diagnosis not present

## 2016-05-22 DIAGNOSIS — M810 Age-related osteoporosis without current pathological fracture: Secondary | ICD-10-CM | POA: Diagnosis not present

## 2016-05-24 DIAGNOSIS — E785 Hyperlipidemia, unspecified: Secondary | ICD-10-CM | POA: Diagnosis not present

## 2016-05-24 DIAGNOSIS — M810 Age-related osteoporosis without current pathological fracture: Secondary | ICD-10-CM | POA: Diagnosis not present

## 2016-05-24 DIAGNOSIS — S3210XD Unspecified fracture of sacrum, subsequent encounter for fracture with routine healing: Secondary | ICD-10-CM | POA: Diagnosis not present

## 2016-05-24 DIAGNOSIS — J449 Chronic obstructive pulmonary disease, unspecified: Secondary | ICD-10-CM | POA: Diagnosis not present

## 2016-05-24 DIAGNOSIS — E039 Hypothyroidism, unspecified: Secondary | ICD-10-CM | POA: Diagnosis not present

## 2016-05-24 DIAGNOSIS — S32040D Wedge compression fracture of fourth lumbar vertebra, subsequent encounter for fracture with routine healing: Secondary | ICD-10-CM | POA: Diagnosis not present

## 2016-05-27 DIAGNOSIS — M810 Age-related osteoporosis without current pathological fracture: Secondary | ICD-10-CM | POA: Diagnosis not present

## 2016-05-27 DIAGNOSIS — S3210XD Unspecified fracture of sacrum, subsequent encounter for fracture with routine healing: Secondary | ICD-10-CM | POA: Diagnosis not present

## 2016-05-27 DIAGNOSIS — S32040D Wedge compression fracture of fourth lumbar vertebra, subsequent encounter for fracture with routine healing: Secondary | ICD-10-CM | POA: Diagnosis not present

## 2016-05-27 DIAGNOSIS — J449 Chronic obstructive pulmonary disease, unspecified: Secondary | ICD-10-CM | POA: Diagnosis not present

## 2016-05-27 DIAGNOSIS — E039 Hypothyroidism, unspecified: Secondary | ICD-10-CM | POA: Diagnosis not present

## 2016-05-27 DIAGNOSIS — E785 Hyperlipidemia, unspecified: Secondary | ICD-10-CM | POA: Diagnosis not present

## 2016-05-31 DIAGNOSIS — S3210XD Unspecified fracture of sacrum, subsequent encounter for fracture with routine healing: Secondary | ICD-10-CM | POA: Diagnosis not present

## 2016-05-31 DIAGNOSIS — E039 Hypothyroidism, unspecified: Secondary | ICD-10-CM | POA: Diagnosis not present

## 2016-05-31 DIAGNOSIS — J449 Chronic obstructive pulmonary disease, unspecified: Secondary | ICD-10-CM | POA: Diagnosis not present

## 2016-05-31 DIAGNOSIS — M810 Age-related osteoporosis without current pathological fracture: Secondary | ICD-10-CM | POA: Diagnosis not present

## 2016-05-31 DIAGNOSIS — E785 Hyperlipidemia, unspecified: Secondary | ICD-10-CM | POA: Diagnosis not present

## 2016-05-31 DIAGNOSIS — S32040D Wedge compression fracture of fourth lumbar vertebra, subsequent encounter for fracture with routine healing: Secondary | ICD-10-CM | POA: Diagnosis not present

## 2016-06-03 DIAGNOSIS — E039 Hypothyroidism, unspecified: Secondary | ICD-10-CM | POA: Diagnosis not present

## 2016-06-03 DIAGNOSIS — S32040D Wedge compression fracture of fourth lumbar vertebra, subsequent encounter for fracture with routine healing: Secondary | ICD-10-CM | POA: Diagnosis not present

## 2016-06-03 DIAGNOSIS — S3210XD Unspecified fracture of sacrum, subsequent encounter for fracture with routine healing: Secondary | ICD-10-CM | POA: Diagnosis not present

## 2016-06-03 DIAGNOSIS — M810 Age-related osteoporosis without current pathological fracture: Secondary | ICD-10-CM | POA: Diagnosis not present

## 2016-06-03 DIAGNOSIS — J449 Chronic obstructive pulmonary disease, unspecified: Secondary | ICD-10-CM | POA: Diagnosis not present

## 2016-06-03 DIAGNOSIS — E785 Hyperlipidemia, unspecified: Secondary | ICD-10-CM | POA: Diagnosis not present

## 2016-06-07 DIAGNOSIS — S3210XD Unspecified fracture of sacrum, subsequent encounter for fracture with routine healing: Secondary | ICD-10-CM | POA: Diagnosis not present

## 2016-06-07 DIAGNOSIS — J449 Chronic obstructive pulmonary disease, unspecified: Secondary | ICD-10-CM | POA: Diagnosis not present

## 2016-06-07 DIAGNOSIS — E785 Hyperlipidemia, unspecified: Secondary | ICD-10-CM | POA: Diagnosis not present

## 2016-06-07 DIAGNOSIS — M810 Age-related osteoporosis without current pathological fracture: Secondary | ICD-10-CM | POA: Diagnosis not present

## 2016-06-07 DIAGNOSIS — E039 Hypothyroidism, unspecified: Secondary | ICD-10-CM | POA: Diagnosis not present

## 2016-06-07 DIAGNOSIS — S32040D Wedge compression fracture of fourth lumbar vertebra, subsequent encounter for fracture with routine healing: Secondary | ICD-10-CM | POA: Diagnosis not present

## 2016-06-10 DIAGNOSIS — R0602 Shortness of breath: Secondary | ICD-10-CM | POA: Diagnosis not present

## 2016-06-10 DIAGNOSIS — Z6822 Body mass index (BMI) 22.0-22.9, adult: Secondary | ICD-10-CM | POA: Diagnosis not present

## 2016-06-10 DIAGNOSIS — G629 Polyneuropathy, unspecified: Secondary | ICD-10-CM | POA: Diagnosis not present

## 2016-06-10 DIAGNOSIS — M4850XA Collapsed vertebra, not elsewhere classified, site unspecified, initial encounter for fracture: Secondary | ICD-10-CM | POA: Diagnosis not present

## 2016-06-11 DIAGNOSIS — M810 Age-related osteoporosis without current pathological fracture: Secondary | ICD-10-CM | POA: Diagnosis not present

## 2016-06-11 DIAGNOSIS — S32040D Wedge compression fracture of fourth lumbar vertebra, subsequent encounter for fracture with routine healing: Secondary | ICD-10-CM | POA: Diagnosis not present

## 2016-06-11 DIAGNOSIS — E039 Hypothyroidism, unspecified: Secondary | ICD-10-CM | POA: Diagnosis not present

## 2016-06-11 DIAGNOSIS — J449 Chronic obstructive pulmonary disease, unspecified: Secondary | ICD-10-CM | POA: Diagnosis not present

## 2016-06-11 DIAGNOSIS — S3210XD Unspecified fracture of sacrum, subsequent encounter for fracture with routine healing: Secondary | ICD-10-CM | POA: Diagnosis not present

## 2016-06-11 DIAGNOSIS — E785 Hyperlipidemia, unspecified: Secondary | ICD-10-CM | POA: Diagnosis not present

## 2016-06-18 DIAGNOSIS — E785 Hyperlipidemia, unspecified: Secondary | ICD-10-CM | POA: Diagnosis not present

## 2016-06-18 DIAGNOSIS — E039 Hypothyroidism, unspecified: Secondary | ICD-10-CM | POA: Diagnosis not present

## 2016-06-18 DIAGNOSIS — S32040D Wedge compression fracture of fourth lumbar vertebra, subsequent encounter for fracture with routine healing: Secondary | ICD-10-CM | POA: Diagnosis not present

## 2016-06-18 DIAGNOSIS — J449 Chronic obstructive pulmonary disease, unspecified: Secondary | ICD-10-CM | POA: Diagnosis not present

## 2016-06-18 DIAGNOSIS — M810 Age-related osteoporosis without current pathological fracture: Secondary | ICD-10-CM | POA: Diagnosis not present

## 2016-06-18 DIAGNOSIS — S3210XD Unspecified fracture of sacrum, subsequent encounter for fracture with routine healing: Secondary | ICD-10-CM | POA: Diagnosis not present

## 2016-08-14 DIAGNOSIS — N393 Stress incontinence (female) (male): Secondary | ICD-10-CM | POA: Diagnosis not present

## 2016-08-14 DIAGNOSIS — Z23 Encounter for immunization: Secondary | ICD-10-CM | POA: Diagnosis not present

## 2016-08-14 DIAGNOSIS — M5430 Sciatica, unspecified side: Secondary | ICD-10-CM | POA: Diagnosis not present

## 2016-08-14 DIAGNOSIS — G629 Polyneuropathy, unspecified: Secondary | ICD-10-CM | POA: Diagnosis not present

## 2016-08-14 DIAGNOSIS — M4850XA Collapsed vertebra, not elsewhere classified, site unspecified, initial encounter for fracture: Secondary | ICD-10-CM | POA: Diagnosis not present

## 2016-09-30 DIAGNOSIS — M5430 Sciatica, unspecified side: Secondary | ICD-10-CM | POA: Diagnosis not present

## 2016-09-30 DIAGNOSIS — G629 Polyneuropathy, unspecified: Secondary | ICD-10-CM | POA: Diagnosis not present

## 2016-09-30 DIAGNOSIS — M4850XA Collapsed vertebra, not elsewhere classified, site unspecified, initial encounter for fracture: Secondary | ICD-10-CM | POA: Diagnosis not present

## 2016-09-30 DIAGNOSIS — E039 Hypothyroidism, unspecified: Secondary | ICD-10-CM | POA: Diagnosis not present

## 2016-09-30 DIAGNOSIS — F3289 Other specified depressive episodes: Secondary | ICD-10-CM | POA: Diagnosis not present

## 2016-09-30 DIAGNOSIS — M129 Arthropathy, unspecified: Secondary | ICD-10-CM | POA: Diagnosis not present

## 2016-09-30 DIAGNOSIS — Z6821 Body mass index (BMI) 21.0-21.9, adult: Secondary | ICD-10-CM | POA: Diagnosis not present

## 2016-09-30 DIAGNOSIS — F330 Major depressive disorder, recurrent, mild: Secondary | ICD-10-CM | POA: Diagnosis not present

## 2016-09-30 DIAGNOSIS — J438 Other emphysema: Secondary | ICD-10-CM | POA: Diagnosis not present

## 2016-11-12 DIAGNOSIS — Z6820 Body mass index (BMI) 20.0-20.9, adult: Secondary | ICD-10-CM | POA: Diagnosis not present

## 2016-11-12 DIAGNOSIS — R531 Weakness: Secondary | ICD-10-CM | POA: Diagnosis not present

## 2016-11-12 DIAGNOSIS — M2550 Pain in unspecified joint: Secondary | ICD-10-CM | POA: Diagnosis not present

## 2017-01-06 DIAGNOSIS — Z6820 Body mass index (BMI) 20.0-20.9, adult: Secondary | ICD-10-CM | POA: Diagnosis not present

## 2017-01-06 DIAGNOSIS — E039 Hypothyroidism, unspecified: Secondary | ICD-10-CM | POA: Diagnosis not present

## 2017-01-06 DIAGNOSIS — M4850XA Collapsed vertebra, not elsewhere classified, site unspecified, initial encounter for fracture: Secondary | ICD-10-CM | POA: Diagnosis not present

## 2017-01-06 DIAGNOSIS — M129 Arthropathy, unspecified: Secondary | ICD-10-CM | POA: Diagnosis not present

## 2017-01-06 DIAGNOSIS — R531 Weakness: Secondary | ICD-10-CM | POA: Diagnosis not present

## 2017-04-02 DIAGNOSIS — I350 Nonrheumatic aortic (valve) stenosis: Secondary | ICD-10-CM | POA: Diagnosis not present

## 2017-04-02 DIAGNOSIS — E559 Vitamin D deficiency, unspecified: Secondary | ICD-10-CM | POA: Diagnosis not present

## 2017-04-02 DIAGNOSIS — F3289 Other specified depressive episodes: Secondary | ICD-10-CM | POA: Diagnosis not present

## 2017-04-02 DIAGNOSIS — G629 Polyneuropathy, unspecified: Secondary | ICD-10-CM | POA: Diagnosis not present

## 2017-04-02 DIAGNOSIS — E039 Hypothyroidism, unspecified: Secondary | ICD-10-CM | POA: Diagnosis not present

## 2017-04-02 DIAGNOSIS — F330 Major depressive disorder, recurrent, mild: Secondary | ICD-10-CM | POA: Diagnosis not present

## 2017-04-27 DIAGNOSIS — R0902 Hypoxemia: Secondary | ICD-10-CM | POA: Diagnosis not present

## 2017-04-27 DIAGNOSIS — M549 Dorsalgia, unspecified: Secondary | ICD-10-CM | POA: Diagnosis not present

## 2017-04-27 DIAGNOSIS — S0990XA Unspecified injury of head, initial encounter: Secondary | ICD-10-CM | POA: Diagnosis not present

## 2017-04-27 DIAGNOSIS — R404 Transient alteration of awareness: Secondary | ICD-10-CM | POA: Diagnosis not present

## 2017-04-27 DIAGNOSIS — E039 Hypothyroidism, unspecified: Secondary | ICD-10-CM | POA: Diagnosis not present

## 2017-04-27 DIAGNOSIS — I4891 Unspecified atrial fibrillation: Secondary | ICD-10-CM | POA: Diagnosis not present

## 2017-04-27 DIAGNOSIS — E041 Nontoxic single thyroid nodule: Secondary | ICD-10-CM | POA: Diagnosis not present

## 2017-04-27 DIAGNOSIS — S199XXA Unspecified injury of neck, initial encounter: Secondary | ICD-10-CM | POA: Diagnosis not present

## 2017-04-27 DIAGNOSIS — Z880 Allergy status to penicillin: Secondary | ICD-10-CM | POA: Diagnosis not present

## 2017-04-27 DIAGNOSIS — M8088XA Other osteoporosis with current pathological fracture, vertebra(e), initial encounter for fracture: Secondary | ICD-10-CM | POA: Diagnosis not present

## 2017-04-27 DIAGNOSIS — S299XXA Unspecified injury of thorax, initial encounter: Secondary | ICD-10-CM | POA: Diagnosis not present

## 2017-04-27 DIAGNOSIS — S79919A Unspecified injury of unspecified hip, initial encounter: Secondary | ICD-10-CM | POA: Diagnosis not present

## 2017-04-27 DIAGNOSIS — J449 Chronic obstructive pulmonary disease, unspecified: Secondary | ICD-10-CM | POA: Diagnosis not present

## 2017-04-27 DIAGNOSIS — M5489 Other dorsalgia: Secondary | ICD-10-CM | POA: Diagnosis not present

## 2017-04-27 DIAGNOSIS — I482 Chronic atrial fibrillation: Secondary | ICD-10-CM | POA: Diagnosis present

## 2017-04-27 DIAGNOSIS — E43 Unspecified severe protein-calorie malnutrition: Secondary | ICD-10-CM | POA: Diagnosis not present

## 2017-04-27 DIAGNOSIS — M25559 Pain in unspecified hip: Secondary | ICD-10-CM | POA: Diagnosis not present

## 2017-04-27 DIAGNOSIS — M546 Pain in thoracic spine: Secondary | ICD-10-CM | POA: Diagnosis not present

## 2017-04-27 DIAGNOSIS — S22008A Other fracture of unspecified thoracic vertebra, initial encounter for closed fracture: Secondary | ICD-10-CM | POA: Diagnosis not present

## 2017-04-27 DIAGNOSIS — Z888 Allergy status to other drugs, medicaments and biological substances status: Secondary | ICD-10-CM | POA: Diagnosis not present

## 2017-04-27 DIAGNOSIS — Z79899 Other long term (current) drug therapy: Secondary | ICD-10-CM | POA: Diagnosis not present

## 2017-04-27 DIAGNOSIS — R531 Weakness: Secondary | ICD-10-CM | POA: Diagnosis not present

## 2017-04-27 DIAGNOSIS — Z681 Body mass index (BMI) 19 or less, adult: Secondary | ICD-10-CM | POA: Diagnosis not present

## 2017-04-27 DIAGNOSIS — R55 Syncope and collapse: Secondary | ICD-10-CM | POA: Diagnosis not present

## 2017-06-03 DIAGNOSIS — N390 Urinary tract infection, site not specified: Secondary | ICD-10-CM | POA: Diagnosis not present

## 2017-06-03 DIAGNOSIS — I2510 Atherosclerotic heart disease of native coronary artery without angina pectoris: Secondary | ICD-10-CM | POA: Diagnosis not present

## 2017-06-03 DIAGNOSIS — I509 Heart failure, unspecified: Secondary | ICD-10-CM | POA: Diagnosis not present

## 2017-06-03 DIAGNOSIS — B9620 Unspecified Escherichia coli [E. coli] as the cause of diseases classified elsewhere: Secondary | ICD-10-CM | POA: Diagnosis not present

## 2017-06-03 DIAGNOSIS — Z951 Presence of aortocoronary bypass graft: Secondary | ICD-10-CM | POA: Diagnosis not present

## 2017-06-03 DIAGNOSIS — R0789 Other chest pain: Secondary | ICD-10-CM | POA: Diagnosis not present

## 2017-06-03 DIAGNOSIS — R0602 Shortness of breath: Secondary | ICD-10-CM | POA: Diagnosis not present

## 2017-06-03 DIAGNOSIS — I110 Hypertensive heart disease with heart failure: Secondary | ICD-10-CM | POA: Diagnosis not present

## 2017-06-03 DIAGNOSIS — I5031 Acute diastolic (congestive) heart failure: Secondary | ICD-10-CM | POA: Diagnosis not present

## 2017-06-04 ENCOUNTER — Encounter: Payer: Self-pay | Admitting: Cardiology

## 2017-06-05 ENCOUNTER — Encounter: Payer: Self-pay | Admitting: Cardiology

## 2017-06-05 DIAGNOSIS — Z880 Allergy status to penicillin: Secondary | ICD-10-CM | POA: Diagnosis not present

## 2017-06-05 DIAGNOSIS — I2510 Atherosclerotic heart disease of native coronary artery without angina pectoris: Secondary | ICD-10-CM | POA: Diagnosis present

## 2017-06-05 DIAGNOSIS — E785 Hyperlipidemia, unspecified: Secondary | ICD-10-CM | POA: Diagnosis present

## 2017-06-05 DIAGNOSIS — K5900 Constipation, unspecified: Secondary | ICD-10-CM | POA: Diagnosis present

## 2017-06-05 DIAGNOSIS — Z79891 Long term (current) use of opiate analgesic: Secondary | ICD-10-CM | POA: Diagnosis not present

## 2017-06-05 DIAGNOSIS — N390 Urinary tract infection, site not specified: Secondary | ICD-10-CM | POA: Diagnosis present

## 2017-06-05 DIAGNOSIS — Z951 Presence of aortocoronary bypass graft: Secondary | ICD-10-CM | POA: Diagnosis not present

## 2017-06-05 DIAGNOSIS — Z888 Allergy status to other drugs, medicaments and biological substances status: Secondary | ICD-10-CM | POA: Diagnosis not present

## 2017-06-05 DIAGNOSIS — I5031 Acute diastolic (congestive) heart failure: Secondary | ICD-10-CM | POA: Diagnosis present

## 2017-06-05 DIAGNOSIS — B9620 Unspecified Escherichia coli [E. coli] as the cause of diseases classified elsewhere: Secondary | ICD-10-CM | POA: Diagnosis present

## 2017-06-05 DIAGNOSIS — Z79899 Other long term (current) drug therapy: Secondary | ICD-10-CM | POA: Diagnosis not present

## 2017-06-05 DIAGNOSIS — E7800 Pure hypercholesterolemia, unspecified: Secondary | ICD-10-CM | POA: Diagnosis present

## 2017-06-05 DIAGNOSIS — J449 Chronic obstructive pulmonary disease, unspecified: Secondary | ICD-10-CM | POA: Diagnosis present

## 2017-06-05 DIAGNOSIS — F419 Anxiety disorder, unspecified: Secondary | ICD-10-CM | POA: Diagnosis present

## 2017-06-05 DIAGNOSIS — K219 Gastro-esophageal reflux disease without esophagitis: Secondary | ICD-10-CM | POA: Diagnosis present

## 2017-06-05 DIAGNOSIS — I110 Hypertensive heart disease with heart failure: Secondary | ICD-10-CM | POA: Diagnosis present

## 2017-06-05 DIAGNOSIS — E039 Hypothyroidism, unspecified: Secondary | ICD-10-CM | POA: Diagnosis present

## 2017-06-16 DIAGNOSIS — G629 Polyneuropathy, unspecified: Secondary | ICD-10-CM | POA: Diagnosis not present

## 2017-06-16 DIAGNOSIS — J438 Other emphysema: Secondary | ICD-10-CM | POA: Diagnosis not present

## 2017-06-16 DIAGNOSIS — R531 Weakness: Secondary | ICD-10-CM | POA: Diagnosis not present

## 2017-06-16 DIAGNOSIS — M129 Arthropathy, unspecified: Secondary | ICD-10-CM | POA: Diagnosis not present

## 2017-06-16 DIAGNOSIS — E039 Hypothyroidism, unspecified: Secondary | ICD-10-CM | POA: Diagnosis not present

## 2017-06-16 DIAGNOSIS — I350 Nonrheumatic aortic (valve) stenosis: Secondary | ICD-10-CM | POA: Diagnosis not present

## 2017-06-18 DIAGNOSIS — Z6820 Body mass index (BMI) 20.0-20.9, adult: Secondary | ICD-10-CM | POA: Diagnosis not present

## 2017-06-18 DIAGNOSIS — M129 Arthropathy, unspecified: Secondary | ICD-10-CM | POA: Diagnosis not present

## 2017-06-18 DIAGNOSIS — F3289 Other specified depressive episodes: Secondary | ICD-10-CM | POA: Diagnosis not present

## 2017-06-18 DIAGNOSIS — J438 Other emphysema: Secondary | ICD-10-CM | POA: Diagnosis not present

## 2017-06-18 DIAGNOSIS — N393 Stress incontinence (female) (male): Secondary | ICD-10-CM | POA: Diagnosis not present

## 2017-06-18 DIAGNOSIS — Z79891 Long term (current) use of opiate analgesic: Secondary | ICD-10-CM | POA: Diagnosis not present

## 2017-06-18 DIAGNOSIS — M4850XA Collapsed vertebra, not elsewhere classified, site unspecified, initial encounter for fracture: Secondary | ICD-10-CM | POA: Diagnosis not present

## 2017-06-18 DIAGNOSIS — F330 Major depressive disorder, recurrent, mild: Secondary | ICD-10-CM | POA: Diagnosis not present

## 2017-06-18 DIAGNOSIS — E039 Hypothyroidism, unspecified: Secondary | ICD-10-CM | POA: Diagnosis not present

## 2017-06-26 DIAGNOSIS — M810 Age-related osteoporosis without current pathological fracture: Secondary | ICD-10-CM | POA: Diagnosis not present

## 2017-07-02 DIAGNOSIS — H04123 Dry eye syndrome of bilateral lacrimal glands: Secondary | ICD-10-CM | POA: Diagnosis not present

## 2017-07-02 DIAGNOSIS — H40033 Anatomical narrow angle, bilateral: Secondary | ICD-10-CM | POA: Diagnosis not present

## 2017-07-07 NOTE — Progress Notes (Deleted)
Cardiology Office Note  Date: 07/07/2017   ID: Kashara, Blocher 05-27-1920, MRN [id number]  PCP: Rory Percy, MD  Primary Cardiologist: Rozann Lesches, MD   No chief complaint on file.   History of Present Illness: CARLON DAVIDSON is a 83 y.o. female last seen in November 2016. She presents to the office for follow-up, records indicate hospitalization at [facility] back in July. She was treated for exacerbation of diastolic heart failure, improved with IV Lasix by report. Follow-up echocardiogram in July is detailed below. She also had an Escherichia coli UTI.  Past Medical History:  Diagnosis Date  . Aortic stenosis    Mild, 06/2012  . Coronary atherosclerosis of native coronary artery    Multivessel status post CABG  . Essential hypertension, benign   . GERD (gastroesophageal reflux disease)   . Hypothyroidism   . Mitral regurgitation    Moderate, 06/2012  . Mixed hyperlipidemia    Statin intolerant    Past Surgical History:  Procedure Laterality Date  . CORONARY ARTERY BYPASS GRAFT     LIMA to LAD, SVG to diagonal    Current Outpatient Prescriptions  Medication Sig Dispense Refill  . albuterol (PROVENTIL) (2.5 MG/3ML) 0.083% nebulizer solution Take 2.5 mg by nebulization every 6 (six) hours as needed.      . Ascorbic Acid (VITAMIN C) 1000 MG tablet Take 1,000 mg by mouth daily.    . Biotin (BIOTIN 5000) 5 MG CAPS Take 2 capsules by mouth daily.    . Calcium Carb-Cholecalciferol (CALCIUM + D3) 600-200 MG-UNIT TABS Take 1 tablet by mouth 2 (two) times daily.     . diclofenac sodium (VOLTAREN) 1 % GEL Apply 1 application topically 4 (four) times daily as needed.    Marland Kitchen esomeprazole (NEXIUM) 40 MG capsule Take 40 mg by mouth daily before breakfast.     . HYDROcodone-acetaminophen (NORCO/VICODIN) 5-325 MG per tablet Take 1.5 tablets by mouth 3 (three) times daily as needed.     Marland Kitchen levothyroxine (SYNTHROID, LEVOTHROID) 112 MCG tablet Take 112 mcg by mouth  daily.      . methocarbamol (ROBAXIN) 500 MG tablet Take 250 mg by mouth at bedtime.    . Multiple Vitamins-Minerals (CENTRUM SILVER PO) Take 1 tablet by mouth daily.      Marland Kitchen PROAIR HFA 108 (90 BASE) MCG/ACT inhaler Inhale 2 puffs into the lungs 2 (two) times daily.    . raloxifene (EVISTA) 60 MG tablet Take 60 mg by mouth daily.       No current facility-administered medications for this visit.    Allergies:  Nitroglycerin; Penicillins; Statins; and Aleve [naproxen]   Social History: The patient  reports that she is a non-smoker but has been exposed to tobacco smoke. She has never used smokeless tobacco. She reports that she does not drink alcohol or use drugs.   Family History: The patient's family history is not on file.   ROS:  Please see the history of present illness. Otherwise, complete review of systems is positive for {NONE DEFAULTED:18576::"none"}.  All other systems are reviewed and negative.   Physical Exam: VS:  There were no vitals taken for this visit., BMI There is no height or weight on file to calculate BMI.  Wt Readings from Last 3 Encounters:  10/18/15 127 lb 3.2 oz (57.7 kg)  03/15/15 125 lb (56.7 kg)  09/06/14 125 lb 12 oz (57 kg)    Elderly woman, appears comfortable.  HEENT: Conjunctiva normal, oropharynx clear.  Neck: Supple, no elevated JVP, soft carotid bruits, no thyromegaly.  Lungs: Clear to auscultation, nonlabored breathing at rest.  Cardiac: Regular rate and rhythm, no S3, 2/6 systolic murmur, no pericardial rub.  Abdomen: Soft, nontender, bowel sounds present.  Extremities: No pitting edema, distal pulses 1-2+.   ECG: I personally reviewed the tracing from 03/15/2015 which showed normal sinus rhythm with nonspecific T-wave changes.  Recent Labwork:  July 2018: BUN 28, creatinine 0.69, potassium 5.0, AST 17, ALT 14, hemoglobin 12.0, platelets 151, troponin T less than 0.013, NT-proBNP 2907  Other Studies Reviewed Today:  Lexiscan Myoview  06/29/2012 [facility]): No diagnostic ST segment changes. Probable diaphragmatic attenuation with no active ischemia and LVEF 58%.  Echocardiogram 06/04/2017 ([facility]): Mild LVH with LVEF 70-35%, grade 3 diastolic dysfunction, mild left atrial enlargement, normal right ventricular contraction, mildly dilated right atrium with secundum ASD and left to right shunt, sclerotic aortic valve with mild aortic regurgitation but no stenosis, moderately thickened mitral valve with moderate to severe mitral stenosis, mild to moderate tricuspid regurgitation with RVSP 30 mmHg.  Assessment and Plan:    Current medicines were reviewed with the patient today.  No orders of the defined types were placed in this encounter.   Disposition:  Signed, Satira Sark, MD, [facility] 07/07/2017 11:14 AM    [facility] [place], [place], [place] [address] Phone: [phone]; Fax: [phone]

## 2017-07-08 ENCOUNTER — Encounter: Payer: Self-pay | Admitting: Cardiology

## 2017-07-08 ENCOUNTER — Ambulatory Visit: Payer: Medicare Other | Admitting: Cardiology

## 2017-07-14 NOTE — Progress Notes (Signed)
Cardiology Office Note  Date: 07/15/2017   ID: Carmen Vaughn, Carmen Vaughn 1920/07/27, MRN [id number]  PCP: Rory Percy, MD  Primary Cardiologist: Rozann Lesches, MD   Chief Complaint  Patient presents with  . Hospitalization Follow-up    History of Present Illness: Carmen Vaughn is a 83 y.o. female last seen in November 2016. She presents to the office for follow-up, records indicate hospitalization at [facility] back in July. She was treated for exacerbation of diastolic heart failure, improved with IV Lasix by report. Follow-up echocardiogram in July is detailed below. She also had an Escherichia coli UTI.  She is here with a family member today. She states that her weight has come down from about 117 pounds to 111 pounds. She has been using Lasix intermittently, not every day. She still has leg edema but states it is better. She elevates her legs when she can. She is otherwise fairly frail, uses a walker, and is at high fall risk. It sounds like she has good support from her family.  Today we discussed the results of her recent echocardiogram and plan for conservative medical therapy.  Past Medical History:  Diagnosis Date  . Aortic stenosis    Mild, 06/2012  . Coronary atherosclerosis of native coronary artery    Multivessel status post CABG  . Essential hypertension, benign   . GERD (gastroesophageal reflux disease)   . Hypothyroidism   . Mitral regurgitation    Moderate, 06/2012  . Mixed hyperlipidemia    Statin intolerant    Past Surgical History:  Procedure Laterality Date  . CORONARY ARTERY BYPASS GRAFT     LIMA to LAD, SVG to diagonal    Current Outpatient Prescriptions  Medication Sig Dispense Refill  . albuterol (PROVENTIL) (2.5 MG/3ML) 0.083% nebulizer solution Take 2.5 mg by nebulization every 6 (six) hours as needed.      . Biotin 5 MG CAPS Take 1 capsule by mouth daily.    . Cholecalciferol (VITAMIN D3) 1000 units CAPS Take 1 capsule by mouth  daily.    . diclofenac sodium (VOLTAREN) 1 % GEL Apply 1 application topically 4 (four) times daily as needed.    . feeding supplement (BOOST / RESOURCE BREEZE) LIQD Take 1 Container by mouth as needed.    Marland Kitchen HYDROcodone-acetaminophen (NORCO) 10-325 MG tablet Take 1 tablet by mouth 3 (three) times daily as needed.    Marland Kitchen levothyroxine (SYNTHROID, LEVOTHROID) 112 MCG tablet Take 112 mcg by mouth daily.      Marland Kitchen PROAIR HFA 108 (90 BASE) MCG/ACT inhaler Inhale 2 puffs into the lungs 2 (two) times daily.    . raloxifene (EVISTA) 60 MG tablet Take 60 mg by mouth daily.       No current facility-administered medications for this visit.    Allergies:  Nitroglycerin; Penicillins; Statins; and Aleve [naproxen]   Social History: The patient  reports that she is a non-smoker but has been exposed to tobacco smoke. She has never used smokeless tobacco. She reports that she does not drink alcohol or use drugs.   ROS:  Please see the history of present illness. Otherwise, complete review of systems is positive for arthritic pains, poor appetite.  All other systems are reviewed and negative.   Physical Exam: VS:  BP 112/60   Pulse 83   Ht 5\' 5"  (1.651 m)   Wt 111 lb 3.2 oz (50.4 kg)   SpO2 95%   BMI 18.50 kg/m , BMI Body mass index  is 18.5 kg/m.  Wt Readings from Last 3 Encounters:  07/15/17 111 lb 3.2 oz (50.4 kg)  10/18/15 127 lb 3.2 oz (57.7 kg)  03/15/15 125 lb (56.7 kg)    Frail elderly woman, no distress, using a walker. HEENT: Conjunctiva normal, oropharynx clear.  Neck: Supple, no elevated JVP, soft carotid bruits, no thyromegaly.  Lungs: Clear to auscultation, nonlabored breathing at rest.  Cardiac: Regular rate and rhythm, no S3, 2/6 systolic murmur, no pericardial rub.  Abdomen: Soft, nontender, bowel sounds present.  Extremities: Mild ankle edema, distal pulses 1-2+. Skin: Warm and dry. Musculoskeletal: Kyphosis noted. Neuropsychiatric: Alert and oriented 3, affect  appropriate.  ECG: I personally reviewed the tracing from 03/15/2015 which showed sinus rhythm with borderline low voltage.  Recent Labwork:  July 2018: BUN 28, creatinine 0.69, potassium 5.0, AST 17, ALT 14, hemoglobin 12.0, platelets 151, troponin T less than 0.013, NT-proBNP 2907  Other Studies Reviewed Today:  Echocardiogram 06/04/2017 [facility]): Mild LVH with LVEF 11-57%, grade 3 diastolic dysfunction, mild left atrial enlargement, normal right ventricular contraction, mildly dilated right atrium with secundum ASD and left to right shunt, sclerotic aortic valve with mild aortic regurgitation but no stenosis, moderately thickened mitral valve with moderate to severe mitral stenosis, mild to moderate tricuspid regurgitation with RVSP 30 mmHg.  Assessment and Plan:  1. Diastolic heart failure with preserved LVEF, also complicated by mitral stenosis. Agree with addition of Lasix, but would use as needed for now based on weight change, gain of 2 pounds in 24 hours. Might need to start standing diuretic eventually, but would not want to diurese her too aggressively.  2. CAD status post CABG. No active angina symptoms.  3. Mitral valve disease, reportedly moderate to severe stenosis by echocardiogram done at [facility]. RVSP was only 30 mmHg.  4. History of hyperlipidemia with statin intolerance.  Current medicines were reviewed with the patient today.  Disposition: Follow-up in 3 months.  Signed, Satira Sark, MD, [facility] 07/15/2017 1:30 PM    [facility] [place], [place], [place] [address] Phone: [phone]; Fax: [phone]

## 2017-07-15 ENCOUNTER — Ambulatory Visit (INDEPENDENT_AMBULATORY_CARE_PROVIDER_SITE_OTHER): Payer: Medicare Other | Admitting: Cardiology

## 2017-07-15 ENCOUNTER — Encounter: Payer: Self-pay | Admitting: Cardiology

## 2017-07-15 VITALS — BP 112/60 | HR 83 | Ht 65.0 in | Wt 111.2 lb

## 2017-07-15 DIAGNOSIS — I2510 Atherosclerotic heart disease of native coronary artery without angina pectoris: Secondary | ICD-10-CM | POA: Diagnosis not present

## 2017-07-15 DIAGNOSIS — I5032 Chronic diastolic (congestive) heart failure: Secondary | ICD-10-CM | POA: Diagnosis not present

## 2017-07-15 DIAGNOSIS — Z789 Other specified health status: Secondary | ICD-10-CM

## 2017-07-15 DIAGNOSIS — I059 Rheumatic mitral valve disease, unspecified: Secondary | ICD-10-CM

## 2017-07-15 MED ORDER — FUROSEMIDE 20 MG PO TABS: 20.0000 mg | ORAL_TABLET | ORAL | 3 refills | Status: DC | PRN

## 2017-07-15 NOTE — Patient Instructions (Signed)
Medication Instructions:  Your physician has recommended you make the following change in your medication:   Begin taking lasix 20 mg as needed for weight gain of 2 lbs in 24 hours  Please continue all other medications as prescribed  Labwork: none  Testing/Procedures: none  Follow-Up: Your physician recommends that you schedule a follow-up appointment in: [place]  Any Other Special Instructions Will Be Listed Below (If Applicable).  If you need a refill on your cardiac medications before your next appointment, please call your pharmacy.

## 2017-08-01 DIAGNOSIS — G44219 Episodic tension-type headache, not intractable: Secondary | ICD-10-CM | POA: Diagnosis not present

## 2017-09-19 DIAGNOSIS — N393 Stress incontinence (female) (male): Secondary | ICD-10-CM | POA: Diagnosis not present

## 2017-09-19 DIAGNOSIS — Z23 Encounter for immunization: Secondary | ICD-10-CM | POA: Diagnosis not present

## 2017-09-19 DIAGNOSIS — F330 Major depressive disorder, recurrent, mild: Secondary | ICD-10-CM | POA: Diagnosis not present

## 2017-09-19 DIAGNOSIS — R634 Abnormal weight loss: Secondary | ICD-10-CM | POA: Diagnosis not present

## 2017-09-19 DIAGNOSIS — M4850XA Collapsed vertebra, not elsewhere classified, site unspecified, initial encounter for fracture: Secondary | ICD-10-CM | POA: Diagnosis not present

## 2017-09-19 DIAGNOSIS — M810 Age-related osteoporosis without current pathological fracture: Secondary | ICD-10-CM | POA: Diagnosis not present

## 2017-09-19 DIAGNOSIS — F3289 Other specified depressive episodes: Secondary | ICD-10-CM | POA: Diagnosis not present

## 2017-10-14 NOTE — Progress Notes (Signed)
Cardiology Office Note  Date: 10/15/2017   ID: Ranelle, Auker May 29, 1920, MRN [id number]  PCP: Rory Percy, MD  Primary Cardiologist: Rozann Lesches, MD   Chief Complaint  Patient presents with  . Diastolic heart failure    History of Present Illness: Carmen Vaughn is a 83 y.o. female last seen in August.  She presents with family member for a follow-up visit.  Her weight has been stable, she has fluctuating leg edema but has to use Lasix very infrequently at this point.  She is also using compression stockings.  She does not report any chest pain or palpitations.  Main complaint is of progressive arthritis pain, particularly back pain.  She uses a walker, denies any recent falls.  I reviewed her current medications.  She has Lasix 20 mg tablets available.  Past Medical History:  Diagnosis Date  . Aortic stenosis    Mild, 06/2012  . Coronary atherosclerosis of native coronary artery    Multivessel status post CABG  . Essential hypertension, benign   . GERD (gastroesophageal reflux disease)   . Hypothyroidism   . Mitral regurgitation    Moderate, 06/2012  . Mixed hyperlipidemia    Statin intolerant    Past Surgical History:  Procedure Laterality Date  . CORONARY ARTERY BYPASS GRAFT     LIMA to LAD, SVG to diagonal    Current Outpatient Medications  Medication Sig Dispense Refill  . albuterol (PROVENTIL) (2.5 MG/3ML) 0.083% nebulizer solution Take 2.5 mg by nebulization every 6 (six) hours as needed.      . Cholecalciferol (VITAMIN D3) 1000 units CAPS Take 1 capsule by mouth daily.    . diclofenac sodium (VOLTAREN) 1 % GEL Apply 1 application topically 4 (four) times daily as needed.    . feeding supplement (BOOST / RESOURCE BREEZE) LIQD Take 1 Container by mouth as needed.    . furosemide (LASIX) 20 MG tablet Take 1 tablet (20 mg total) by mouth as needed (For 2lb weight gain in 24 hours). 30 tablet 3  . HYDROcodone-acetaminophen (NORCO) 10-325 MG tablet Take 1  tablet by mouth 3 (three) times daily as needed.    Marland Kitchen levothyroxine (SYNTHROID, LEVOTHROID) 100 MCG tablet Take 1 tablet by mouth daily.    Marland Kitchen PROAIR HFA 108 (90 BASE) MCG/ACT inhaler Inhale 2 puffs into the lungs 2 (two) times daily.    . raloxifene (EVISTA) 60 MG tablet Take 60 mg by mouth daily.       No current facility-administered medications for this visit.    Allergies:  Nitroglycerin; Penicillins; Statins; and Aleve [naproxen]   Social History: The patient  reports that she is a non-smoker but has been exposed to tobacco smoke. she has never used smokeless tobacco. She reports that she does not drink alcohol or use drugs.   ROS:  Please see the history of present illness. Otherwise, complete review of systems is positive for chronic back pain.  All other systems are reviewed and negative.   Physical Exam: VS:  BP 92/60   Pulse 79   Ht 5\' 5"  (1.651 m)   Wt 110 lb (49.9 kg)   SpO2 98%   BMI 18.30 kg/m , BMI Body mass index is 18.3 kg/m.  Wt Readings from Last 3 Encounters:  10/15/17 110 lb (49.9 kg)  07/15/17 111 lb 3.2 oz (50.4 kg)  10/18/15 127 lb 3.2 oz (57.7 kg)    General: Frail elderly woman using a walker. HEENT: Conjunctiva and lids normal,  oropharynx clear. Neck: Supple, no elevated JVP or carotid bruits, no thyromegaly. Lungs: Clear to auscultation, nonlabored breathing at rest. Cardiac: Regular rate and rhythm, no S3, 2/6 systolic murmur, no pericardial rub. Abdomen: Soft, nontender, bowel sounds present. Extremities: Mild lower leg edema with compression stockings. Skin: Warm and dry. Musculoskeletal: Kyphosis present. Neuropsychiatric: Alert and oriented x3, affect grossly appropriate.  ECG: I personally reviewed the tracing from 03/15/2015 which showed sinus rhythm, nonspecific T wave changes.  Recent Labwork:  July 2018: BUN 28, creatinine 0.69, potassium 5.0, AST 17, ALT 14, hemoglobin 12.0, platelets 151, troponin T less than 0.013, NT-proBNP  2907  Other Studies Reviewed Today:  Echocardiogram 06/04/2017 [facility] Mild LVH with LVEF 85-88%, grade 3 diastolic dysfunction, mild left atrial enlargement, normal right ventricular contraction, mildly dilated right atrium with secundum ASD and left to right shunt, sclerotic aortic valve with mild aortic regurgitation but no stenosis, moderately thickened mitral valve with moderate to severe mitral stenosis, mild to moderate tricuspid regurgitation with RVSP 30 mmHg.  Assessment and Plan:  1.  Chronic diastolic heart failure in the setting of mitral stenosis.  Weight is stable and she is using Lasix on an as-needed basis at this point.  2.  CAD status post CABG.  She does not report any active angina symptoms.  Current medicines were reviewed with the patient today.  Disposition: Follow-up in 4 months.  Signed, Satira Sark, MD, [facility] 10/15/2017 2:21 PM    [place] at [place], [place], [place] [address] Phone: [phone]; Fax: [phone]

## 2017-10-15 ENCOUNTER — Ambulatory Visit (INDEPENDENT_AMBULATORY_CARE_PROVIDER_SITE_OTHER): Payer: Medicare Other | Admitting: Cardiology

## 2017-10-15 ENCOUNTER — Encounter: Payer: Self-pay | Admitting: Cardiology

## 2017-10-15 VITALS — BP 92/60 | HR 79 | Ht 65.0 in | Wt 110.0 lb

## 2017-10-15 DIAGNOSIS — I5032 Chronic diastolic (congestive) heart failure: Secondary | ICD-10-CM | POA: Diagnosis not present

## 2017-10-15 DIAGNOSIS — I2510 Atherosclerotic heart disease of native coronary artery without angina pectoris: Secondary | ICD-10-CM | POA: Diagnosis not present

## 2017-10-15 NOTE — Patient Instructions (Signed)
Medication Instructions:  Your physician recommends that you continue on your current medications as directed. Please refer to the Current Medication list given to you today.  Labwork: NONE  Testing/Procedures: NONE  Follow-Up: Your physician recommends that you schedule a follow-up appointment in: 4 MONTHS WITH DR. MCDOWELL  Any Other Special Instructions Will Be Listed Below (If Applicable).  If you need a refill on your cardiac medications before your next appointment, please call your pharmacy. 

## 2017-10-29 DIAGNOSIS — M810 Age-related osteoporosis without current pathological fracture: Secondary | ICD-10-CM | POA: Diagnosis not present

## 2017-10-29 DIAGNOSIS — N393 Stress incontinence (female) (male): Secondary | ICD-10-CM | POA: Diagnosis not present

## 2017-10-29 DIAGNOSIS — Z6820 Body mass index (BMI) 20.0-20.9, adult: Secondary | ICD-10-CM | POA: Diagnosis not present

## 2017-10-29 DIAGNOSIS — R634 Abnormal weight loss: Secondary | ICD-10-CM | POA: Diagnosis not present

## 2017-10-29 DIAGNOSIS — M4850XA Collapsed vertebra, not elsewhere classified, site unspecified, initial encounter for fracture: Secondary | ICD-10-CM | POA: Diagnosis not present

## 2017-10-29 DIAGNOSIS — F3289 Other specified depressive episodes: Secondary | ICD-10-CM | POA: Diagnosis not present

## 2017-11-05 DIAGNOSIS — M810 Age-related osteoporosis without current pathological fracture: Secondary | ICD-10-CM | POA: Diagnosis not present

## 2017-12-30 ENCOUNTER — Ambulatory Visit (INDEPENDENT_AMBULATORY_CARE_PROVIDER_SITE_OTHER): Payer: Medicare Other | Admitting: Urology

## 2017-12-30 DIAGNOSIS — R3915 Urgency of urination: Secondary | ICD-10-CM | POA: Diagnosis not present

## 2017-12-30 DIAGNOSIS — R825 Elevated urine levels of drugs, medicaments and biological substances: Secondary | ICD-10-CM

## 2017-12-30 DIAGNOSIS — N3941 Urge incontinence: Secondary | ICD-10-CM

## 2018-02-05 DIAGNOSIS — R634 Abnormal weight loss: Secondary | ICD-10-CM | POA: Diagnosis not present

## 2018-02-05 DIAGNOSIS — E039 Hypothyroidism, unspecified: Secondary | ICD-10-CM | POA: Diagnosis not present

## 2018-02-05 DIAGNOSIS — M129 Arthropathy, unspecified: Secondary | ICD-10-CM | POA: Diagnosis not present

## 2018-02-05 DIAGNOSIS — W458XXA Other foreign body or object entering through skin, initial encounter: Secondary | ICD-10-CM | POA: Diagnosis not present

## 2018-02-05 DIAGNOSIS — N393 Stress incontinence (female) (male): Secondary | ICD-10-CM | POA: Diagnosis not present

## 2018-02-05 DIAGNOSIS — M4850XA Collapsed vertebra, not elsewhere classified, site unspecified, initial encounter for fracture: Secondary | ICD-10-CM | POA: Diagnosis not present

## 2018-02-05 DIAGNOSIS — Z6820 Body mass index (BMI) 20.0-20.9, adult: Secondary | ICD-10-CM | POA: Diagnosis not present

## 2018-02-05 DIAGNOSIS — I350 Nonrheumatic aortic (valve) stenosis: Secondary | ICD-10-CM | POA: Diagnosis not present

## 2018-02-05 DIAGNOSIS — F3289 Other specified depressive episodes: Secondary | ICD-10-CM | POA: Diagnosis not present

## 2018-02-05 DIAGNOSIS — R0602 Shortness of breath: Secondary | ICD-10-CM | POA: Diagnosis not present

## 2018-02-10 DIAGNOSIS — L02213 Cutaneous abscess of chest wall: Secondary | ICD-10-CM | POA: Diagnosis not present

## 2018-02-10 DIAGNOSIS — M868X8 Other osteomyelitis, other site: Secondary | ICD-10-CM | POA: Diagnosis not present

## 2018-02-10 DIAGNOSIS — Z951 Presence of aortocoronary bypass graft: Secondary | ICD-10-CM | POA: Diagnosis not present

## 2018-02-10 DIAGNOSIS — T8140XA Infection following a procedure, unspecified, initial encounter: Secondary | ICD-10-CM | POA: Diagnosis not present

## 2018-02-10 DIAGNOSIS — I700 Atherosclerosis of aorta: Secondary | ICD-10-CM | POA: Diagnosis not present

## 2018-02-10 DIAGNOSIS — Z9889 Other specified postprocedural states: Secondary | ICD-10-CM | POA: Diagnosis not present

## 2018-02-16 NOTE — Progress Notes (Signed)
Cardiology Office Note  Date: 02/17/2018   ID: Lossie, Kalp [birth year], MRN [id number]  PCP: Rory Percy, MD  Primary Cardiologist: Rozann Lesches, MD   Chief Complaint  Patient presents with  . Coronary Artery Disease    History of Present Illness: AYERIM BERQUIST is a 84 y.o. female last seen in November 2018.  She is here today with her son.  She reports chronic progressive shortness of breath, uses breathing treatments regularly, but no definite wheezing.  She ambulates with a walker.  Does not report frank chest pain.  Her last echocardiogram was done at [facility] in July of last year.  LVEF was normal although she had restrictive diastolic filling pattern and moderate to severe mitral stenosis which I suspect is contributing to her shortness of breath as well.  She is not a good surgical candidate.  Weight is up about 6 pounds.  She has Lasix to take as needed.  I reviewed her ECG today which shows rate controlled atrial fibrillation.  Past Medical History:  Diagnosis Date  . Aortic stenosis    Mild, 06/2012  . Coronary atherosclerosis of native coronary artery    Multivessel status post CABG  . Essential hypertension, benign   . GERD (gastroesophageal reflux disease)   . Hypothyroidism   . Mitral regurgitation    Moderate, 06/2012  . Mixed hyperlipidemia    Statin intolerant    Past Surgical History:  Procedure Laterality Date  . CORONARY ARTERY BYPASS GRAFT     LIMA to LAD, SVG to diagonal    Current Outpatient Medications  Medication Sig Dispense Refill  . albuterol (PROVENTIL) (2.5 MG/3ML) 0.083% nebulizer solution Take 2.5 mg by nebulization every 6 (six) hours as needed.      . Cholecalciferol (VITAMIN D3) 1000 units CAPS Take 1 capsule by mouth daily.    . diclofenac sodium (VOLTAREN) 1 % GEL Apply 1 application topically 4 (four) times daily as needed.    . feeding supplement (BOOST / RESOURCE BREEZE) LIQD Take 1 Container by  mouth as needed.    . furosemide (LASIX) 20 MG tablet Take 1 tablet (20 mg total) by mouth as needed (For 2lb weight gain in 24 hours). 90 tablet 1  . HYDROcodone-acetaminophen (NORCO) 10-325 MG tablet Take 1 tablet by mouth 3 (three) times daily as needed.    Marland Kitchen levothyroxine (SYNTHROID, LEVOTHROID) 100 MCG tablet Take 1 tablet by mouth daily.    Marland Kitchen PROAIR HFA 108 (90 BASE) MCG/ACT inhaler Inhale 2 puffs into the lungs 2 (two) times daily.    . raloxifene (EVISTA) 60 MG tablet Take 60 mg by mouth daily.       No current facility-administered medications for this visit.    Allergies:  Nitroglycerin; Penicillins; Statins; and Aleve [naproxen]   Social History: The patient  reports that she is a non-smoker but has been exposed to tobacco smoke. She has never used smokeless tobacco. She reports that she does not drink alcohol or use drugs.   ROS:  Please see the history of present illness. Otherwise, complete review of systems is positive for hearing loss.  All other systems are reviewed and negative.   Physical Exam: VS:  BP 110/62 (BP Location: Right Arm)   Pulse 91   Ht 5\' 4"  (1.626 m)   Wt 116 lb (52.6 kg)   SpO2 97%   BMI 19.91 kg/m , BMI Body mass index is 19.91 kg/m.  Wt Readings from Last  3 Encounters:  02/17/18 116 lb (52.6 kg)  10/15/17 110 lb (49.9 kg)  07/15/17 111 lb 3.2 oz (50.4 kg)    General: Frail-appearing elderly woman, using a walker. HEENT: Conjunctiva and lids normal, oropharynx clear. Neck: Supple, no elevated JVP or carotid bruits, no thyromegaly. Lungs: Clear to auscultation, nonlabored breathing at rest. Cardiac: Irregularly irregular, no S3, 2/6 systolic and soft diastolic murmur, no pericardial rub. Abdomen: Soft, nontender, bowel sounds present. Extremities: 2+ leg edema, distal pulses 2+. Skin: Warm and dry. Musculoskeletal:  Kyphosis noted. Neuropsychiatric: Alert and oriented x3, affect grossly appropriate.  ECG: I personally reviewed the tracing  from 03/15/2015 which showed sinus rhythm, nonspecific T wave changes.  Recent Labwork:  July 2018: BUN 28, creatinine 0.69, potassium 5.0, AST 17, ALT 14, hemoglobin 12.0, platelets 151, troponin T less than 0.013, NT-proBNP 2907  Other Studies Reviewed Today:  Echocardiogram 06/04/2017 [facility] Mild LVH with LVEF 09-81%, grade 3 diastolic dysfunction, mild left atrial enlargement, normal right ventricular contraction, mildly dilated right atrium with secundum ASD and left to right shunt, sclerotic aortic valve with mild aortic regurgitation but no stenosis, moderately thickened mitral valve with moderate to severe mitral stenosis, mild to moderate tricuspid regurgitation with RVSP 30 mmHg.  Assessment and Plan:  1.  Progressive shortness of breath, likely multifactorial.  She has atrial fibrillation which is newly documented but not surprising with significant valvular heart disease including moderate to severe mitral stenosis and restrictive diastolic filling pattern.  She is not a good candidate for anticoagulation, would have to take Coumadin instead of DOAC in light of significant mitral valve disease.  She is not a good surgical candidate.  We did discuss obtaining a follow-up echocardiogram for prognostic reasons, also increase Lasix use from as needed to daily for the next 3 or 4 days to try to get her leg swelling improved and weight down about 5 pounds.  2.  CAD status post CABG.  She reports no active angina.  Could be ischemic component to some of her shortness of breath as well.  We have been following her conservatively.  3.  Chronic diastolic heart failure in the setting of mitral stenosis.  Increasing diuretics as noted above.  Current medicines were reviewed with the patient today.   Orders Placed This Encounter  Procedures  . EKG 12-Lead  . ECHOCARDIOGRAM COMPLETE    Disposition: Call with test results.  Signed, Satira Sark, MD,  [facility] 02/17/2018 4:44 PM    [place] at [place], [place], [place] [address] Phone: [phone]; Fax: [phone]

## 2018-02-17 ENCOUNTER — Telehealth: Payer: Self-pay | Admitting: Cardiology

## 2018-02-17 ENCOUNTER — Encounter: Payer: Self-pay | Admitting: Cardiology

## 2018-02-17 ENCOUNTER — Ambulatory Visit (INDEPENDENT_AMBULATORY_CARE_PROVIDER_SITE_OTHER): Payer: Medicare Other | Admitting: Cardiology

## 2018-02-17 VITALS — BP 110/62 | HR 91 | Ht 64.0 in | Wt 116.0 lb

## 2018-02-17 DIAGNOSIS — I059 Rheumatic mitral valve disease, unspecified: Secondary | ICD-10-CM | POA: Diagnosis not present

## 2018-02-17 DIAGNOSIS — R0602 Shortness of breath: Secondary | ICD-10-CM | POA: Diagnosis not present

## 2018-02-17 DIAGNOSIS — I4819 Other persistent atrial fibrillation: Secondary | ICD-10-CM

## 2018-02-17 DIAGNOSIS — I5032 Chronic diastolic (congestive) heart failure: Secondary | ICD-10-CM | POA: Diagnosis not present

## 2018-02-17 DIAGNOSIS — I2510 Atherosclerotic heart disease of native coronary artery without angina pectoris: Secondary | ICD-10-CM

## 2018-02-17 DIAGNOSIS — I481 Persistent atrial fibrillation: Secondary | ICD-10-CM

## 2018-02-17 MED ORDER — FUROSEMIDE 20 MG PO TABS: 20.0000 mg | ORAL_TABLET | ORAL | 1 refills | Status: DC | PRN

## 2018-02-17 NOTE — Telephone Encounter (Signed)
Echo scheduled in [facility] April  17,2019

## 2018-02-17 NOTE — Patient Instructions (Signed)
Your physician recommends that you schedule a follow-up appointment in: TO BE DETERMINED WITH DR Barnstable  Your physician has recommended you make the following change in your medication:   [place] NEXT 3-4 DAYS   Your physician has requested that you have an echocardiogram. Echocardiography is a painless test that uses sound waves to create images of your heart. It provides your doctor with information about the size and shape of your heart and how well your heart's chambers and valves are working. This procedure takes approximately one hour. There are no restrictions for this procedure.   Thank you for choosing [facility]!!

## 2018-02-20 DIAGNOSIS — I700 Atherosclerosis of aorta: Secondary | ICD-10-CM | POA: Diagnosis not present

## 2018-02-20 DIAGNOSIS — I517 Cardiomegaly: Secondary | ICD-10-CM | POA: Diagnosis not present

## 2018-02-20 DIAGNOSIS — K838 Other specified diseases of biliary tract: Secondary | ICD-10-CM | POA: Diagnosis not present

## 2018-02-20 DIAGNOSIS — T8149XS Infection following a procedure, other surgical site, sequela: Secondary | ICD-10-CM | POA: Diagnosis not present

## 2018-02-20 DIAGNOSIS — N200 Calculus of kidney: Secondary | ICD-10-CM | POA: Diagnosis not present

## 2018-02-20 DIAGNOSIS — M4854XD Collapsed vertebra, not elsewhere classified, thoracic region, subsequent encounter for fracture with routine healing: Secondary | ICD-10-CM | POA: Diagnosis not present

## 2018-02-20 DIAGNOSIS — R0789 Other chest pain: Secondary | ICD-10-CM | POA: Diagnosis not present

## 2018-03-02 DIAGNOSIS — R600 Localized edema: Secondary | ICD-10-CM | POA: Diagnosis not present

## 2018-03-02 DIAGNOSIS — I350 Nonrheumatic aortic (valve) stenosis: Secondary | ICD-10-CM | POA: Diagnosis not present

## 2018-03-02 DIAGNOSIS — R0602 Shortness of breath: Secondary | ICD-10-CM | POA: Diagnosis not present

## 2018-03-02 DIAGNOSIS — J438 Other emphysema: Secondary | ICD-10-CM | POA: Diagnosis not present

## 2018-03-02 DIAGNOSIS — Z6822 Body mass index (BMI) 22.0-22.9, adult: Secondary | ICD-10-CM | POA: Diagnosis not present

## 2018-03-06 ENCOUNTER — Encounter: Payer: Self-pay | Admitting: Cardiothoracic Surgery

## 2018-03-06 ENCOUNTER — Other Ambulatory Visit: Payer: Self-pay | Admitting: *Deleted

## 2018-03-06 ENCOUNTER — Ambulatory Visit (INDEPENDENT_AMBULATORY_CARE_PROVIDER_SITE_OTHER): Payer: Medicare Other | Admitting: Cardiothoracic Surgery

## 2018-03-06 ENCOUNTER — Ambulatory Visit: Payer: Medicare Other | Admitting: Cardiothoracic Surgery

## 2018-03-06 VITALS — BP 128/82 | HR 63 | Resp 20 | Ht 64.0 in | Wt 116.0 lb

## 2018-03-06 DIAGNOSIS — Z951 Presence of aortocoronary bypass graft: Secondary | ICD-10-CM | POA: Diagnosis not present

## 2018-03-06 DIAGNOSIS — T8189XA Other complications of procedures, not elsewhere classified, initial encounter: Secondary | ICD-10-CM | POA: Diagnosis not present

## 2018-03-06 DIAGNOSIS — I2510 Atherosclerotic heart disease of native coronary artery without angina pectoris: Secondary | ICD-10-CM | POA: Diagnosis not present

## 2018-03-06 NOTE — Progress Notes (Signed)
PCP is Rory Percy, MD Referring Provider is Rory Percy, MD  Chief Complaint  Patient presents with  . Chest Pain    eval proturding sternal wire, HX of CABG     HPI: Patient presents for evaluation of a exposed sternal wire.  The patient had CABG in 2002 for unstable angina.  The patient states the wire is very painful.  The patient has evidence of chronic diastolic heart failure related to moderate mitral stenosis by echocardiogram last year.  EF is 55%.  There is no significant aortic valve disease.  Patient currently lives at home and is not very active-she presents today in a wheelchair.  She is alert and oriented and definitely is having pain symptoms from the exposed wire.  There is been no drainage from the site or fever or evidence of cellulitis.   The patient presents with her family and wishes to have the exposed wire removed.  Patient is very thin and fragile and other wires are easily seen but if not protruded to the skin.  Patient is taking no specific anticoagulation.  The patient has 2+ pedal edema on Lasix and has hypertension.  No symptoms of angina.  Past Medical History:  Diagnosis Date  . Aortic stenosis    Mild, 06/2012  . Coronary atherosclerosis of native coronary artery    Multivessel status post CABG  . Essential hypertension, benign   . GERD (gastroesophageal reflux disease)   . Hypothyroidism   . Mitral regurgitation    Moderate, 06/2012  . Mixed hyperlipidemia    Statin intolerant    Past Surgical History:  Procedure Laterality Date  . CORONARY ARTERY BYPASS GRAFT     LIMA to LAD, SVG to diagonal    History reviewed. No pertinent family history.  Social History Social History   Tobacco Use  . Smoking status: Passive Smoke Exposure - Never Smoker  . Smokeless tobacco: Never Used  . Tobacco comment: Husband smoked  Substance Use Topics  . Alcohol use: No    Alcohol/week: 0.0 oz  . Drug use: No    Current Outpatient Medications   Medication Sig Dispense Refill  . albuterol (PROVENTIL) (2.5 MG/3ML) 0.083% nebulizer solution Take 2.5 mg by nebulization every 6 (six) hours as needed.      . Cholecalciferol (VITAMIN D3) 1000 units CAPS Take 1 capsule by mouth daily.    . diclofenac sodium (VOLTAREN) 1 % GEL Apply 1 application topically 4 (four) times daily as needed.    . feeding supplement (BOOST / RESOURCE BREEZE) LIQD Take 1 Container by mouth as needed.    . furosemide (LASIX) 20 MG tablet Take 1 tablet (20 mg total) by mouth as needed (For 2lb weight gain in 24 hours). 90 tablet 1  . HYDROcodone-acetaminophen (NORCO) 10-325 MG tablet Take 1 tablet by mouth 3 (three) times daily as needed.    Marland Kitchen levothyroxine (SYNTHROID, LEVOTHROID) 100 MCG tablet Take 1 tablet by mouth daily.    Marland Kitchen PROAIR HFA 108 (90 BASE) MCG/ACT inhaler Inhale 2 puffs into the lungs 2 (two) times daily.    . raloxifene (EVISTA) 60 MG tablet Take 60 mg by mouth daily.       No current facility-administered medications for this visit.     Allergies  Allergen Reactions  . Nitroglycerin     REACTION: bp drop  . Penicillins     REACTION: rash  . Statins   . Aleve [Naproxen] Rash    Review of Systems  Weight stable  No fever No syncope or headache or change in vision No shortness of breath orthopnea PND Patient has neuropathy of her lower legs bilaterally with loss of sensation Patient is not ambulatory without assistance Denies any recent hospitalizations Patient's cardiologist is Dr. Rozann Lesches.   BP 128/82   Pulse 63   Resp 20   Ht 5\' 4"  (1.626 m)   Wt 116 lb (52.6 kg)   SpO2 95% Comment: RA  BMI 19.91 kg/m  Physical Exam General-elderly fragile female no acute distress in a wheelchair accompanied by family ENT- dentition adequate, no JVD or bruit Thorax-well-healed sternal incision with a exposed tail of 1 wire protruding through the skin a few millimeters without surrounding evidence of infection  Cardiac-regular pulse,  no murmur Abdomen-soft nontender Extremities- 2+ pedal edema, venous stasis changes, bruised area on lateral aspect of right lower extremity with ecchymoses and a blistered area which was treated with a Unna boot wrap and Ace wrap Neuro-generally weak-left hand dominant  Diagnostic Tests:  x-rays performed at [facility] not available  Impression: Exposed sternal wire, painful, not infected  Plan: I feel it is reasonable to proceed with sternal wire removal.  The patient states she has a very low pain tolerance and removing it in the office  would be painful and not a compassionate approach for the care of this elderly patient.  We will schedule her for sternal wire removal in the OR next week at [facility].  This should be done as an outpatient.  I discussed the procedure with the patient and her family including the benefits and risks. Len Childs, MD [facility] [phone]

## 2018-03-09 ENCOUNTER — Other Ambulatory Visit: Payer: Self-pay | Admitting: *Deleted

## 2018-03-09 DIAGNOSIS — T8189XA Other complications of procedures, not elsewhere classified, initial encounter: Secondary | ICD-10-CM

## 2018-03-11 ENCOUNTER — Other Ambulatory Visit: Payer: Medicare Other

## 2018-03-12 ENCOUNTER — Other Ambulatory Visit: Payer: Self-pay

## 2018-03-12 ENCOUNTER — Ambulatory Visit (HOSPITAL_COMMUNITY)
Admission: RE | Admit: 2018-03-12 | Discharge: 2018-03-12 | Disposition: A | Discharge status: Home / Self Care | Payer: Medicare Other | Source: Ambulatory Visit | Attending: Cardiothoracic Surgery | Admitting: Cardiothoracic Surgery

## 2018-03-12 ENCOUNTER — Encounter (HOSPITAL_COMMUNITY)
Admission: RE | Admit: 2018-03-12 | Discharge: 2018-03-12 | Disposition: A | Discharge status: Home / Self Care | Payer: Medicare Other | Source: Ambulatory Visit | Attending: Cardiothoracic Surgery | Admitting: Cardiothoracic Surgery

## 2018-03-12 ENCOUNTER — Encounter (HOSPITAL_COMMUNITY): Payer: Self-pay

## 2018-03-12 DIAGNOSIS — Z951 Presence of aortocoronary bypass graft: Secondary | ICD-10-CM | POA: Diagnosis not present

## 2018-03-12 DIAGNOSIS — Z01812 Encounter for preprocedural laboratory examination: Secondary | ICD-10-CM | POA: Insufficient documentation

## 2018-03-12 DIAGNOSIS — I4891 Unspecified atrial fibrillation: Secondary | ICD-10-CM | POA: Diagnosis not present

## 2018-03-12 DIAGNOSIS — I050 Rheumatic mitral stenosis: Secondary | ICD-10-CM | POA: Diagnosis not present

## 2018-03-12 DIAGNOSIS — T8189XA Other complications of procedures, not elsewhere classified, initial encounter: Secondary | ICD-10-CM | POA: Insufficient documentation

## 2018-03-12 DIAGNOSIS — I700 Atherosclerosis of aorta: Secondary | ICD-10-CM | POA: Diagnosis not present

## 2018-03-12 DIAGNOSIS — T849XXA Unspecified complication of internal orthopedic prosthetic device, implant and graft, initial encounter: Secondary | ICD-10-CM | POA: Diagnosis not present

## 2018-03-12 DIAGNOSIS — Z01818 Encounter for other preprocedural examination: Secondary | ICD-10-CM | POA: Insufficient documentation

## 2018-03-12 DIAGNOSIS — I2510 Atherosclerotic heart disease of native coronary artery without angina pectoris: Secondary | ICD-10-CM | POA: Diagnosis not present

## 2018-03-12 HISTORY — DX: Personal history of urinary calculi: Z87.442

## 2018-03-12 HISTORY — DX: Chronic obstructive pulmonary disease, unspecified: J44.9

## 2018-03-12 HISTORY — DX: Unspecified osteoarthritis, unspecified site: M19.90

## 2018-03-12 LAB — COMPREHENSIVE METABOLIC PANEL
ALT: 18 U/L (ref 14–54)
AST: 23 U/L (ref 15–41)
Albumin: 3.9 g/dL (ref 3.5–5.0)
Alkaline Phosphatase: 40 U/L (ref 38–126)
Anion gap: 11 (ref 5–15)
BUN: 23 mg/dL — ABNORMAL HIGH (ref 6–20)
CO2: 24 mmol/L (ref 22–32)
Calcium: 8.6 mg/dL — ABNORMAL LOW (ref 8.9–10.3)
Chloride: 104 mmol/L (ref 101–111)
Creatinine, Ser: 0.93 mg/dL (ref 0.44–1.00)
GFR calc Af Amer: 59 mL/min — ABNORMAL LOW (ref >60)
GFR calc non Af Amer: 51 mL/min — ABNORMAL LOW (ref >60)
Glucose, Bld: 96 mg/dL (ref 65–99)
Potassium: 4.3 mmol/L (ref 3.5–5.1)
Sodium: 139 mmol/L (ref 135–145)
Total Bilirubin: 1.3 mg/dL — ABNORMAL HIGH (ref 0.3–1.2)
Total Protein: 7 g/dL (ref 6.5–8.1)

## 2018-03-12 LAB — CBC
HCT: 36.3 % (ref 36.0–46.0)
Hemoglobin: 11.6 g/dL — ABNORMAL LOW (ref 12.0–15.0)
MCH: 31.6 pg (ref 26.0–34.0)
MCHC: 32 g/dL (ref 30.0–36.0)
MCV: 98.9 fL (ref 78.0–100.0)
Platelets: 132 10*3/uL — ABNORMAL LOW (ref 150–400)
RBC: 3.67 MIL/uL — ABNORMAL LOW (ref 3.87–5.11)
RDW: 13.1 % (ref 11.5–15.5)
WBC: 6.4 10*3/uL (ref 4.0–10.5)

## 2018-03-12 LAB — SURGICAL PCR SCREEN
MRSA, PCR: NEGATIVE
Staphylococcus aureus: NEGATIVE

## 2018-03-12 LAB — APTT: aPTT: 28 seconds (ref 24–36)

## 2018-03-12 LAB — PROTIME-INR
INR: 1.14
Prothrombin Time: 14.5 seconds (ref 11.4–15.2)

## 2018-03-12 NOTE — Progress Notes (Signed)
Anesthesia Consult:   Case:  [id number] Date/Time:  03/13/18 0712   Procedure:  STERNAL WIRES REMOVAL (N/A )   Anesthesia type:  Monitor Anesthesia Care   Pre-op diagnosis:  EXPOSED STERNAL WIRE   Location:  [place] [facility] / [place] OR   Surgeon:  Prescott Gum, Collier Salina, MD      DISCUSSION: Pt has moderate to severe mitral stenosis, but per cardiologist Dr. Domenic Polite, is not a surgical candidate. Hx CAD (s/p CABG 2002).   Called to see pt for SOB. Pt lives independently. Son reports pt called him this morning to c/o SOB.  He arrived at her house and found her drowsey. Pt told her son she had not yet used breathing treatment that morning; he administered albuterol nebulizer and SOB symptoms and drowsiness resolved. She states she feels like her usual self now.  She denies feeling ill.  On exam, lungs are CTA. CXR without acute finding.    VS: BP (!) 147/66   Pulse 82   Temp 36.5 C   Resp 20   Ht 5\' 4"  (1.626 m)   Wt 117 lb (53.1 kg)   SpO2 98%   BMI 20.08 kg/m   PROVIDERS: - PCP is Rory Percy, MD - Cardiologist is Rozann Lesches, MD. Last office visit 02/17/18   LABS: Labs reviewed: Acceptable for surgery. (all labs ordered are listed, but only abnormal results are displayed)  Labs Reviewed  CBC - Abnormal; Notable for the following components:      Result Value   RBC 3.67 (*)    Hemoglobin 11.6 (*)    Platelets 132 (*)    All other components within normal limits  COMPREHENSIVE METABOLIC PANEL - Abnormal; Notable for the following components:   BUN 23 (*)    Calcium 8.6 (*)    Total Bilirubin 1.3 (*)    GFR calc non Af Amer 51 (*)    GFR calc Af Amer 59 (*)    All other components within normal limits  SURGICAL PCR SCREEN  APTT  PROTIME-INR     IMAGES:  CXR 03/12/18:   CT chest 02/25/18:  1.  No acute findings.  No parasternal or retrosternal fluid collection, abscess or evidence of soft tissue inflammation.  Sternum itself appears intact and normal in mineralization.   Median sternotomy wires appear intact and appropriately positioned.  Lungs are clear. 2.  Stable cardiomegaly.  No pericardial effusion. 3.  Aortic atherosclerosis. 4.  Chronic pneumobilia. 5.  Bilateral nephrolithiasis. 6.  Chronic compression fracture deformities within the thoracic spine with associated kyphosis.  Again, no acute appearing osseous abnormality.  EKG 02/17/18: Atrial fibrillation (84 bpm)   CV:  Echo 06/04/17:  1.  Mild concentric LVH is observed.  Estimated EF 55-60%.  LV diastolic filling pattern is restrictive, reversible, grade III diastolic dysfunction.  LV diastolic filling pattern was consistent with elevated LV end-diastolic pressure. 2.  LA mildly dilated. 3.  RV global systolic function normal. 4.  RA mildly dilated. 5.  There is a secundum atrial septal defect. There is predominant left to right shunting across the intra-atrial septum.  Atrial septal defect is demonstrated by color Doppler. 6.  Aortic valve leaflets mildly thickened.  There is mild aortic regurgitation. 7.  Mitral valve leaflets moderately thickened.  There is moderate to severe mitral stenosis. 8.  Mild to moderate tricuspid regurgitation. 9.  RV systolic pressure calculated at 30 mmHg   Past Medical History:  Diagnosis Date  . Aortic stenosis  Mild, 06/2012  . Arthritis   . COPD (chronic obstructive pulmonary disease) ([place])   . Coronary atherosclerosis of native coronary artery    Multivessel status post CABG  . Dyspnea    on exertion  . Essential hypertension, benign   . GERD (gastroesophageal reflux disease)   . History of kidney stones   . Hypothyroidism   . Mitral regurgitation    Moderate, 06/2012  . Mixed hyperlipidemia    Statin intolerant    Past Surgical History:  Procedure Laterality Date  . ABDOMINAL HYSTERECTOMY    . CORONARY ARTERY BYPASS GRAFT  2002   LIMA to LAD, SVG to diagonal  . KYPHOPLASTY     two times    MEDICATIONS: . albuterol (PROVENTIL) (2.5  MG/3ML) 0.083% nebulizer solution  . Cholecalciferol (VITAMIN D3) 1000 units CAPS  . diclofenac sodium (VOLTAREN) 1 % GEL  . feeding supplement (BOOST / RESOURCE BREEZE) LIQD  . furosemide (LASIX) 20 MG tablet  . HYDROcodone-acetaminophen (NORCO) 10-325 MG tablet  . levothyroxine (SYNTHROID, LEVOTHROID) 100 MCG tablet  . PROAIR HFA 108 (90 BASE) MCG/ACT inhaler  . raloxifene (EVISTA) 60 MG tablet   No current facility-administered medications for this encounter.     If no changes, I anticipate pt can proceed with surgery as scheduled.   Willeen Cass, FNP-BC [facility]Anesthesiology Phone: [phone] 03/12/2018 1:28 PM

## 2018-03-12 NOTE — Progress Notes (Addendum)
PCP: Dr. Rory Percy @ [facility] in [facility]  Cardiologist: Dr. Rozann Lesches  Pt. Reports last week she had a skin tear on lower right leg. Dr. Prescott Gum applied a sulfur wrap and told her he would take it off on her surgery day.

## 2018-03-12 NOTE — Pre-Procedure Instructions (Signed)
Carmen Vaughn  03/12/2018      [facility] - [place], [place] [address] Phone: [phone] Fax: [phone]    Your procedure is scheduled on Fri. April 19  Report to [facility] Admitting at 5:30 A.M.  Call this number if you have problems the morning of surgery:  [phone]   Remember:  Do not eat food or drink liquids after midnight tonight                  Take these medicines the morning of surgery with A SIP OF WATER : albuterol nebulizer if needed, hydrocodone if needed,levothyroxine (synthroid),proair inhaler--bring to hospital, raloxifene (evista)              7 days prior to surgery STOP taking any Aspirin(unless otherwise instructed by your surgeon), Aleve, Naproxen, Ibuprofen, Motrin, Advil, Goody's, BC's, all herbal medications, fish oil, and all vitamins   Do not wear jewelry, make-up or nail polish.  Do not wear lotions, powders, or perfumes, or deodorant.  Do not shave 48 hours prior to surgery.  Men may shave face and neck.  Do not bring valuables to the hospital.  [facility] is not responsible for any belongings or valuables.  Contacts, dentures or bridgework may not be worn into surgery.  Leave your suitcase in the car.  After surgery it may be brought to your room.  For patients admitted to the hospital, discharge time will be determined by your treatment team.  Patients discharged the day of surgery will not be allowed to drive home.    Special instructions:   [facility]- Preparing For Surgery  Before surgery, you can play an important role. Because skin is not sterile, your skin needs to be as free of germs as possible. You can reduce the number of germs on your skin by washing with CHG (chlorahexidine gluconate) Soap before surgery.  CHG is an antiseptic cleaner which kills germs and bonds with the skin to continue killing germs even after washing.  Please do not use if you have an  allergy to CHG or antibacterial soaps. If your skin becomes reddened/irritated stop using the CHG.  Do not shave (including legs and underarms) for at least 48 hours prior to first CHG shower. It is OK to shave your face.  Please follow these instructions carefully.   1. Shower the NIGHT BEFORE SURGERY and the MORNING OF SURGERY with CHG.   2. If you chose to wash your hair, wash your hair first as usual with your normal shampoo.  3. After you shampoo, rinse your hair and body thoroughly to remove the shampoo.  4. Use CHG as you would any other liquid soap. You can apply CHG directly to the skin and wash gently with a scrungie or a clean washcloth.   5. Apply the CHG Soap to your body ONLY FROM THE NECK DOWN.  Do not use on open wounds or open sores. Avoid contact with your eyes, ears, mouth and genitals (private parts). Wash Face and genitals (private parts)  with your normal soap.  6. Wash thoroughly, paying special attention to the area where your surgery will be performed.  7. Thoroughly rinse your body with warm water from the neck down.  8. DO NOT shower/wash with your normal soap after using and rinsing off the CHG Soap.  9. Pat yourself dry with a CLEAN TOWEL.  10.  Wear CLEAN PAJAMAS to bed the night before surgery, wear comfortable clothes the morning of surgery  11. Place CLEAN SHEETS on your bed the night of your first shower and DO NOT SLEEP WITH PETS.    Day of Surgery: Do not apply any deodorants/lotions. Please wear clean clothes to the hospital/surgery center.      Please read over the following fact sheets that you were given. Coughing and Deep Breathing, MRSA Information and Surgical Site Infection Prevention

## 2018-03-13 ENCOUNTER — Ambulatory Visit (HOSPITAL_COMMUNITY): Payer: Medicare Other | Admitting: Certified Registered"

## 2018-03-13 ENCOUNTER — Encounter (HOSPITAL_COMMUNITY)
Admission: RE | Disposition: A | Discharge status: Home / Self Care | Payer: Self-pay | Source: Ambulatory Visit | Attending: Cardiothoracic Surgery

## 2018-03-13 ENCOUNTER — Ambulatory Visit (HOSPITAL_COMMUNITY)
Admission: RE | Admit: 2018-03-13 | Discharge: 2018-03-13 | Disposition: A | Discharge status: Home / Self Care | Payer: Medicare Other | Source: Ambulatory Visit | Attending: Cardiothoracic Surgery | Admitting: Cardiothoracic Surgery

## 2018-03-13 ENCOUNTER — Encounter (HOSPITAL_COMMUNITY): Payer: Self-pay | Admitting: Urology

## 2018-03-13 DIAGNOSIS — Z880 Allergy status to penicillin: Secondary | ICD-10-CM | POA: Insufficient documentation

## 2018-03-13 DIAGNOSIS — J449 Chronic obstructive pulmonary disease, unspecified: Secondary | ICD-10-CM | POA: Insufficient documentation

## 2018-03-13 DIAGNOSIS — Z888 Allergy status to other drugs, medicaments and biological substances status: Secondary | ICD-10-CM | POA: Insufficient documentation

## 2018-03-13 DIAGNOSIS — Z951 Presence of aortocoronary bypass graft: Secondary | ICD-10-CM | POA: Diagnosis not present

## 2018-03-13 DIAGNOSIS — I2510 Atherosclerotic heart disease of native coronary artery without angina pectoris: Secondary | ICD-10-CM | POA: Diagnosis not present

## 2018-03-13 DIAGNOSIS — T8189XA Other complications of procedures, not elsewhere classified, initial encounter: Secondary | ICD-10-CM

## 2018-03-13 DIAGNOSIS — T85848A Pain due to other internal prosthetic devices, implants and grafts, initial encounter: Secondary | ICD-10-CM | POA: Diagnosis not present

## 2018-03-13 DIAGNOSIS — Z886 Allergy status to analgesic agent status: Secondary | ICD-10-CM | POA: Insufficient documentation

## 2018-03-13 DIAGNOSIS — E039 Hypothyroidism, unspecified: Secondary | ICD-10-CM | POA: Diagnosis not present

## 2018-03-13 DIAGNOSIS — I739 Peripheral vascular disease, unspecified: Secondary | ICD-10-CM | POA: Insufficient documentation

## 2018-03-13 DIAGNOSIS — I10 Essential (primary) hypertension: Secondary | ICD-10-CM | POA: Diagnosis not present

## 2018-03-13 DIAGNOSIS — M1990 Unspecified osteoarthritis, unspecified site: Secondary | ICD-10-CM | POA: Diagnosis not present

## 2018-03-13 DIAGNOSIS — K219 Gastro-esophageal reflux disease without esophagitis: Secondary | ICD-10-CM | POA: Insufficient documentation

## 2018-03-13 DIAGNOSIS — Y713 Surgical instruments, materials and cardiovascular devices (including sutures) associated with adverse incidents: Secondary | ICD-10-CM | POA: Insufficient documentation

## 2018-03-13 DIAGNOSIS — Z79899 Other long term (current) drug therapy: Secondary | ICD-10-CM | POA: Diagnosis not present

## 2018-03-13 DIAGNOSIS — T8489XA Other specified complication of internal orthopedic prosthetic devices, implants and grafts, initial encounter: Secondary | ICD-10-CM | POA: Diagnosis not present

## 2018-03-13 HISTORY — PX: STERNAL WIRES REMOVAL: SHX2441

## 2018-03-13 SURGERY — REMOVAL, STERNAL WIRE
Anesthesia: Monitor Anesthesia Care

## 2018-03-13 MED ORDER — SODIUM CHLORIDE 0.9 % IR SOLN
Status: DC | PRN
  Administered 2018-03-13: 2000 mL

## 2018-03-13 MED ORDER — LACTATED RINGERS IV SOLN
INTRAVENOUS | Status: DC | PRN
  Administered 2018-03-13: 07:00:00 via INTRAVENOUS

## 2018-03-13 MED ORDER — FENTANYL CITRATE (PF) 250 MCG/5ML IJ SOLN
INTRAMUSCULAR | Status: DC | PRN
  Administered 2018-03-13: 50 ug via INTRAVENOUS
  Administered 2018-03-13 (×2): 25 ug via INTRAVENOUS

## 2018-03-13 MED ORDER — PROPOFOL 10 MG/ML IV BOLUS
INTRAVENOUS | Status: AC
  Filled 2018-03-13: qty 20

## 2018-03-13 MED ORDER — FENTANYL CITRATE (PF) 250 MCG/5ML IJ SOLN
INTRAMUSCULAR | Status: AC
  Filled 2018-03-13: qty 5

## 2018-03-13 MED ORDER — LIDOCAINE HCL (CARDIAC) PF 100 MG/5ML IV SOSY
PREFILLED_SYRINGE | INTRAVENOUS | Status: DC | PRN
  Administered 2018-03-13: 100 mg via INTRATRACHEAL

## 2018-03-13 MED ORDER — SODIUM CHLORIDE 0.9 % IV SOLN: 250.0000 mL | INTRAVENOUS | Status: DC | PRN

## 2018-03-13 MED ORDER — OXYCODONE HCL 5 MG PO TABS
5.0000 mg | ORAL_TABLET | ORAL | Status: DC | PRN
  Administered 2018-03-13: 5 mg via ORAL

## 2018-03-13 MED ORDER — LIDOCAINE HCL (PF) 1 % IJ SOLN
INTRAMUSCULAR | Status: AC
  Filled 2018-03-13: qty 30

## 2018-03-13 MED ORDER — ACETAMINOPHEN 650 MG RE SUPP: 650.0000 mg | RECTAL | Status: DC | PRN

## 2018-03-13 MED ORDER — SODIUM CHLORIDE 0.9% FLUSH: 3.0000 mL | Freq: Two times a day (BID) | INTRAVENOUS | Status: DC

## 2018-03-13 MED ORDER — ACETAMINOPHEN 325 MG PO TABS
650.0000 mg | ORAL_TABLET | ORAL | Status: DC | PRN
  Administered 2018-03-13: 650 mg via ORAL

## 2018-03-13 MED ORDER — ACETAMINOPHEN 325 MG PO TABS
ORAL_TABLET | ORAL | Status: AC
  Filled 2018-03-13: qty 2

## 2018-03-13 MED ORDER — SODIUM CHLORIDE 0.9% FLUSH: 3.0000 mL | INTRAVENOUS | Status: DC | PRN

## 2018-03-13 MED ORDER — LIDOCAINE 2% (20 MG/ML) 5 ML SYRINGE
INTRAMUSCULAR | Status: AC
  Filled 2018-03-13: qty 10

## 2018-03-13 MED ORDER — VANCOMYCIN HCL IN DEXTROSE 1-5 GM/200ML-% IV SOLN
1000.0000 mg | INTRAVENOUS | Status: AC
  Administered 2018-03-13: 1000 mg via INTRAVENOUS
  Filled 2018-03-13: qty 200

## 2018-03-13 MED ORDER — FENTANYL CITRATE (PF) 100 MCG/2ML IJ SOLN: 25.0000 ug | INTRAMUSCULAR | Status: DC | PRN

## 2018-03-13 MED ORDER — LIDOCAINE HCL (PF) 1 % IJ SOLN
INTRAMUSCULAR | Status: DC | PRN
  Administered 2018-03-13: 2 mL

## 2018-03-13 MED ORDER — OXYCODONE HCL 5 MG PO TABS
ORAL_TABLET | ORAL | Status: AC
  Filled 2018-03-13: qty 1

## 2018-03-13 MED ORDER — ROCURONIUM BROMIDE 10 MG/ML (PF) SYRINGE
PREFILLED_SYRINGE | INTRAVENOUS | Status: AC
  Filled 2018-03-13: qty 5

## 2018-03-13 SURGICAL SUPPLY — 45 items
APL SKNCLS STERI-STRIP NONHPOA (GAUZE/BANDAGES/DRESSINGS)
ATTRACTOMAT 16X20 MAGNETIC DRP (DRAPES) ×3 IMPLANT
BAG DECANTER FOR FLEXI CONT (MISCELLANEOUS) ×3 IMPLANT
BENZOIN TINCTURE PRP APPL 2/3 (GAUZE/BANDAGES/DRESSINGS) IMPLANT
BLADE SURG 10 STRL SS (BLADE) ×6 IMPLANT
CANISTER SUCT 3000ML PPV (MISCELLANEOUS) ×3 IMPLANT
CLIP VESOCCLUDE SM WIDE 24/CT (CLIP) IMPLANT
CONT SPEC 4OZ CLIKSEAL STRL BL (MISCELLANEOUS) IMPLANT
DRAPE LAPAROSCOPIC ABDOMINAL (DRAPES) ×3 IMPLANT
DRSG AQUACEL AG ADV 3.5X14 (GAUZE/BANDAGES/DRESSINGS) ×3 IMPLANT
ELECT REM PT RETURN 9FT ADLT (ELECTROSURGICAL) ×3
ELECTRODE REM PT RTRN 9FT ADLT (ELECTROSURGICAL) ×1 IMPLANT
GAUZE SPONGE 4X4 12PLY STRL (GAUZE/BANDAGES/DRESSINGS) ×3 IMPLANT
GAUZE XEROFORM 1X8 LF (GAUZE/BANDAGES/DRESSINGS) ×2 IMPLANT
GAUZE XEROFORM 5X9 LF (GAUZE/BANDAGES/DRESSINGS) IMPLANT
GLOVE BIO SURGEON STRL SZ 6.5 (GLOVE) ×4 IMPLANT
GLOVE BIO SURGEON STRL SZ7.5 (GLOVE) ×6 IMPLANT
GLOVE BIO SURGEONS STRL SZ 6.5 (GLOVE) ×2
GOWN STRL REUS W/ TWL LRG LVL3 (GOWN DISPOSABLE) ×4 IMPLANT
GOWN STRL REUS W/TWL LRG LVL3 (GOWN DISPOSABLE) ×12
HEMOSTAT POWDER SURGIFOAM 1G (HEMOSTASIS) IMPLANT
HEMOSTAT SURGICEL 2X14 (HEMOSTASIS) IMPLANT
KIT BASIN OR (CUSTOM PROCEDURE TRAY) ×3 IMPLANT
KIT TURNOVER KIT B (KITS) ×3 IMPLANT
MARKER SKIN DUAL TIP RULER LAB (MISCELLANEOUS) IMPLANT
NS IRRIG 1000ML POUR BTL (IV SOLUTION) ×3 IMPLANT
PACK CHEST (CUSTOM PROCEDURE TRAY) ×3 IMPLANT
PAD ARMBOARD 7.5X6 YLW CONV (MISCELLANEOUS) ×6 IMPLANT
SOL PREP POV-IOD 4OZ 10% (MISCELLANEOUS) IMPLANT
SPONGE LAP 18X18 X RAY DECT (DISPOSABLE) ×3 IMPLANT
SUT ETHILON 3 0 FSL (SUTURE) IMPLANT
SUT ETHILON 3 0 PS 1 (SUTURE) ×2 IMPLANT
SUT STEEL 6MS V (SUTURE) IMPLANT
SUT STEEL STERNAL CCS#1 18IN (SUTURE) IMPLANT
SUT STEEL SZ 6 DBL 3X14 BALL (SUTURE) IMPLANT
SUT VIC AB 1 CTX 36 (SUTURE)
SUT VIC AB 1 CTX36XBRD ANBCTR (SUTURE) IMPLANT
SUT VIC AB 2-0 CTX 27 (SUTURE) IMPLANT
SUT VIC AB 3-0 X1 27 (SUTURE) IMPLANT
SWAB COLLECTION DEVICE MRSA (MISCELLANEOUS) IMPLANT
SWAB CULTURE ESWAB REG 1ML (MISCELLANEOUS) IMPLANT
TAPE CLOTH SURG 4X10 WHT LF (GAUZE/BANDAGES/DRESSINGS) ×2 IMPLANT
TOWEL GREEN STERILE (TOWEL DISPOSABLE) ×3 IMPLANT
TOWEL GREEN STERILE FF (TOWEL DISPOSABLE) ×3 IMPLANT
WATER STERILE IRR 1000ML POUR (IV SOLUTION) ×3 IMPLANT

## 2018-03-13 NOTE — Transfer of Care (Signed)
Immediate Anesthesia Transfer of Care Note  Patient: Carmen Vaughn  Procedure(s) Performed: STERNAL WIRES REMOVAL (N/A )  Patient Location: PACU  Anesthesia Type:MAC  Level of Consciousness: awake and patient cooperative  Airway & Oxygen Therapy: Patient Spontanous Breathing  Post-op Assessment: Report given to RN and Post -op Vital signs reviewed and stable  Post vital signs: Reviewed and stable  Last Vitals:  Vitals Value Taken Time  BP 131/70 03/13/2018  7:59 AM  Temp    Pulse 66 03/13/2018  8:01 AM  Resp 5 03/13/2018  8:01 AM  SpO2 95 % 03/13/2018  8:01 AM  Vitals shown include unvalidated device data.  Last Pain:  Vitals:   03/13/18 0554  TempSrc: Oral         Complications: No apparent anesthesia complications

## 2018-03-13 NOTE — Op Note (Signed)
NAME:  Carmen Vaughn, Carmen Vaughn                      ACCOUNT NO.:  MEDICAL RECORD NO.:  [id number]  LOCATION:                                 FACILITY:  PHYSICIAN:  Ivin Poot, M.D.       DATE OF BIRTH:  DATE OF PROCEDURE:  03/13/2018 DATE OF DISCHARGE:                              OPERATIVE REPORT   OPERATION:  Removal of sternal wire.  PREOPERATIVE DIAGNOSIS:  Painful exposed sternal wire after previous CABG.  POSTOPERATIVE DIAGNOSIS:  Painful exposed sternal wire after previous CABG.  ANESTHESIA:  Local 1% lidocaine with IV conscious sedation, monitored by Anesthesia.  DESCRIPTION OF PROCEDURE:  The patient was brought from preop holding where an informed consent was documented and the patient was examined and in good condition for surgery.  The patient was placed supine on the operating table and the chest was prepped and draped.  She was monitored by Anesthesia.  A proper time-out was performed.  1% lidocaine was infiltrated around the exposed portion of the upper sternal wire.  A small incision was made to expose the wire.  The wire was partially untwisted and then cut with a wire cutter and fully removed.  The incision was closed with interrupted nylon skin sutures.  The patient received minimal IV conscious sedation and tolerated procedure well and returned to recovery room.     Ivin Poot, M.D.     PV/MEDQ  D:  03/13/2018  T:  03/13/2018  Job:  [id number]

## 2018-03-13 NOTE — Anesthesia Procedure Notes (Signed)
Procedure Name: MAC Date/Time: 03/13/2018 7:28 AM Performed by: Lance Coon, CRNA Pre-anesthesia Checklist: Patient identified, Emergency Drugs available, Suction available, Patient being monitored and Timeout performed Patient Re-evaluated:Patient Re-evaluated prior to induction Oxygen Delivery Method: Nasal cannula

## 2018-03-13 NOTE — Progress Notes (Signed)
Pre Procedure note for inpatients:   Carmen Vaughn has been scheduled for Procedure(s): STERNAL WIRES REMOVAL (N/A) today. The various methods of treatment have been discussed with the patient. After consideration of the risks, benefits and treatment options the patient has consented to the planned procedure.   The patient has been seen and labs reviewed. There are no changes in the patient's condition to prevent proceeding with the planned procedure today.  Recent labs:  Lab Results  Component Value Date   WBC 6.4 03/12/2018   HGB 11.6 (L) 03/12/2018   HCT 36.3 03/12/2018   PLT 132 (L) 03/12/2018   GLUCOSE 96 03/12/2018   ALT 18 03/12/2018   AST 23 03/12/2018   NA 139 03/12/2018   K 4.3 03/12/2018   CL 104 03/12/2018   CREATININE 0.93 03/12/2018   BUN 23 (H) 03/12/2018   CO2 24 03/12/2018   INR 1.14 03/12/2018    Len Childs, MD 03/13/2018 7:10 AM

## 2018-03-13 NOTE — Brief Op Note (Signed)
03/13/2018  7:53 AM  PATIENT:  Carmen Vaughn  84 y.o. female  PRE-OPERATIVE DIAGNOSIS:  EXPOSED STERNAL WIRE  POST-OPERATIVE DIAGNOSIS:  EXPOSED STERNAL WIRE  PROCEDURE:  Procedure(s): STERNAL WIRES REMOVAL (N/A)  SURGEON:  Surgeon(s) and Role:    Ivin Poot, MD - Primary  PHYSICIAN ASSISTANT:   ASSISTANTS: none   ANESTHESIA:   local  EBL: 2 cc   BLOOD ADMINISTERED:none  DRAINS: none   LOCAL MEDICATIONS USED:  LIDOCAINE  and Amount: 5 ml  SPECIMEN:  No Specimen  DISPOSITION OF SPECIMEN:  N/A  COUNTS:  YES  TOURNIQUET:  * No tourniquets in log *  DICTATION: .Dragon Dictation  PLAN OF CARE: Discharge to home after PACU  PATIENT DISPOSITION:  PACU - hemodynamically stable.   Delay start of Pharmacological VTE agent (>24hrs) due to surgical blood loss or risk of bleeding: yes

## 2018-03-13 NOTE — Discharge Instructions (Signed)
Take off bandage in 24 hours - cover with bandaide Keep incision dry 72 hrs then shower ok No driving for 48 hrs Office will call about suture removal

## 2018-03-13 NOTE — Anesthesia Preprocedure Evaluation (Signed)
Anesthesia Evaluation  Patient identified by MRN, date of birth, ID band Patient awake    Reviewed: Allergy & Precautions, NPO status , Patient's Chart, lab work & pertinent test results  History of Anesthesia Complications Negative for: history of anesthetic complications  Airway Mallampati: I  TM Distance: >3 FB Neck ROM: Full    Dental  (+) Edentulous Upper, Edentulous Lower   Pulmonary shortness of breath, COPD,    breath sounds clear to auscultation       Cardiovascular hypertension, + CAD, + CABG and + Peripheral Vascular Disease   Rhythm:Regular     Neuro/Psych negative neurological ROS  negative psych ROS   GI/Hepatic Neg liver ROS, GERD  ,  Endo/Other  Hypothyroidism   Renal/GU negative Renal ROS     Musculoskeletal  (+) Arthritis ,   Abdominal   Peds  Hematology   Anesthesia Other Findings   Reproductive/Obstetrics                             Anesthesia Physical Anesthesia Plan  ASA: III  Anesthesia Plan: MAC   Post-op Pain Management:    Induction:   PONV Risk Score and Plan: 2 and Treatment may vary due to age or medical condition  Airway Management Planned: Nasal Cannula  Additional Equipment:   Intra-op Plan:   Post-operative Plan:   Informed Consent: I have reviewed the patients History and Physical, chart, labs and discussed the procedure including the risks, benefits and alternatives for the proposed anesthesia with the patient or authorized representative who has indicated his/her understanding and acceptance.   Dental advisory given  Plan Discussed with: CRNA and Surgeon  Anesthesia Plan Comments:         Anesthesia Quick Evaluation

## 2018-03-13 NOTE — Anesthesia Postprocedure Evaluation (Signed)
Anesthesia Post Note  Patient: Carmen Vaughn  Procedure(s) Performed: STERNAL WIRES REMOVAL (N/A )     Patient location during evaluation: PACU Anesthesia Type: MAC Level of consciousness: awake and alert Pain management: pain level controlled Vital Signs Assessment: post-procedure vital signs reviewed and stable Respiratory status: spontaneous breathing, nonlabored ventilation, respiratory function stable and patient connected to nasal cannula oxygen Cardiovascular status: stable and blood pressure returned to baseline Postop Assessment: no apparent nausea or vomiting Anesthetic complications: no    Last Vitals:  Vitals:   03/13/18 0844 03/13/18 0900  BP: 115/70 119/73  Pulse: 68   Resp: 14 (!) 26  Temp: 36.4 C   SpO2: 100% 97%    Last Pain:  Vitals:   03/13/18 0800  TempSrc:   PainSc: 0-No pain                 Yusef Lamp

## 2018-03-14 ENCOUNTER — Encounter (HOSPITAL_COMMUNITY): Payer: Self-pay | Admitting: Cardiothoracic Surgery

## 2018-03-18 NOTE — Telephone Encounter (Signed)
Rescheduled to Apr 15, 2018 [facility]

## 2018-03-25 ENCOUNTER — Ambulatory Visit: Payer: Self-pay

## 2018-03-26 ENCOUNTER — Other Ambulatory Visit: Payer: Self-pay

## 2018-03-26 ENCOUNTER — Ambulatory Visit: Payer: Medicare Other

## 2018-03-26 ENCOUNTER — Ambulatory Visit (INDEPENDENT_AMBULATORY_CARE_PROVIDER_SITE_OTHER): Payer: Self-pay

## 2018-03-26 ENCOUNTER — Telehealth: Payer: Self-pay

## 2018-03-26 DIAGNOSIS — Z4802 Encounter for removal of sutures: Secondary | ICD-10-CM

## 2018-03-26 NOTE — Progress Notes (Signed)
Patient arrived for nurse visit to remove 3 sutures post- procedure of sternal wire removal.  Sutures removed with no signs/ symptoms of infection noted.  Patient tolerated procedure well.  Patient/ family instructed to keep the incision sites clean and dry.  Patient did show me wound on right leg that was done weeks ago when putting on clothes.  I advised to keep it open to air as often as possible but placed a dry gauze on it and ace wrap.  Dr. Prescott Gum will be notified of wound.  Patient also told to follow up with her PCP about wound care.  Patient/ family acknowledged instructions given.

## 2018-03-26 NOTE — Telephone Encounter (Signed)
Dr. Prescott Gum advised patient to do wet-to-dry dressings on the right lower leg daily and get in to see her PCP or wound specialist as soon as possible to get the wound evaluated.  Patient's son was contacted and told about dressing changes and assisted with steps to take in order to do the dressing changes over the phone.  He acknowledged receipt and was told to call back with any other questions or concerns.

## 2018-04-07 DIAGNOSIS — L84 Corns and callosities: Secondary | ICD-10-CM | POA: Diagnosis not present

## 2018-04-07 DIAGNOSIS — M79676 Pain in unspecified toe(s): Secondary | ICD-10-CM | POA: Diagnosis not present

## 2018-04-07 DIAGNOSIS — B351 Tinea unguium: Secondary | ICD-10-CM | POA: Diagnosis not present

## 2018-04-07 DIAGNOSIS — I70203 Unspecified atherosclerosis of native arteries of extremities, bilateral legs: Secondary | ICD-10-CM | POA: Diagnosis not present

## 2018-04-15 ENCOUNTER — Other Ambulatory Visit: Payer: Medicare Other

## 2018-04-15 DIAGNOSIS — L97811 Non-pressure chronic ulcer of other part of right lower leg limited to breakdown of skin: Secondary | ICD-10-CM | POA: Diagnosis not present

## 2018-04-15 DIAGNOSIS — L97818 Non-pressure chronic ulcer of other part of right lower leg with other specified severity: Secondary | ICD-10-CM | POA: Diagnosis not present

## 2018-04-15 DIAGNOSIS — I83028 Varicose veins of left lower extremity with ulcer other part of lower leg: Secondary | ICD-10-CM | POA: Diagnosis not present

## 2018-04-15 DIAGNOSIS — I83018 Varicose veins of right lower extremity with ulcer other part of lower leg: Secondary | ICD-10-CM | POA: Diagnosis not present

## 2018-04-15 DIAGNOSIS — L97828 Non-pressure chronic ulcer of other part of left lower leg with other specified severity: Secondary | ICD-10-CM | POA: Diagnosis not present

## 2018-04-17 DIAGNOSIS — I872 Venous insufficiency (chronic) (peripheral): Secondary | ICD-10-CM | POA: Diagnosis not present

## 2018-04-22 DIAGNOSIS — L97811 Non-pressure chronic ulcer of other part of right lower leg limited to breakdown of skin: Secondary | ICD-10-CM | POA: Diagnosis not present

## 2018-04-22 DIAGNOSIS — L97818 Non-pressure chronic ulcer of other part of right lower leg with other specified severity: Secondary | ICD-10-CM | POA: Diagnosis not present

## 2018-04-22 DIAGNOSIS — I83028 Varicose veins of left lower extremity with ulcer other part of lower leg: Secondary | ICD-10-CM | POA: Diagnosis not present

## 2018-04-22 DIAGNOSIS — L97828 Non-pressure chronic ulcer of other part of left lower leg with other specified severity: Secondary | ICD-10-CM | POA: Diagnosis not present

## 2018-04-22 DIAGNOSIS — I83018 Varicose veins of right lower extremity with ulcer other part of lower leg: Secondary | ICD-10-CM | POA: Diagnosis not present

## 2018-04-29 DIAGNOSIS — L97811 Non-pressure chronic ulcer of other part of right lower leg limited to breakdown of skin: Secondary | ICD-10-CM | POA: Diagnosis not present

## 2018-04-29 DIAGNOSIS — I83018 Varicose veins of right lower extremity with ulcer other part of lower leg: Secondary | ICD-10-CM | POA: Diagnosis not present

## 2018-04-29 DIAGNOSIS — S81811D Laceration without foreign body, right lower leg, subsequent encounter: Secondary | ICD-10-CM | POA: Diagnosis not present

## 2018-05-04 DIAGNOSIS — G629 Polyneuropathy, unspecified: Secondary | ICD-10-CM | POA: Diagnosis not present

## 2018-05-04 DIAGNOSIS — Z6822 Body mass index (BMI) 22.0-22.9, adult: Secondary | ICD-10-CM | POA: Diagnosis not present

## 2018-05-04 DIAGNOSIS — M810 Age-related osteoporosis without current pathological fracture: Secondary | ICD-10-CM | POA: Diagnosis not present

## 2018-05-04 DIAGNOSIS — F3289 Other specified depressive episodes: Secondary | ICD-10-CM | POA: Diagnosis not present

## 2018-05-04 DIAGNOSIS — R531 Weakness: Secondary | ICD-10-CM | POA: Diagnosis not present

## 2018-05-04 DIAGNOSIS — M129 Arthropathy, unspecified: Secondary | ICD-10-CM | POA: Diagnosis not present

## 2018-05-04 DIAGNOSIS — E039 Hypothyroidism, unspecified: Secondary | ICD-10-CM | POA: Diagnosis not present

## 2018-05-04 DIAGNOSIS — M2550 Pain in unspecified joint: Secondary | ICD-10-CM | POA: Diagnosis not present

## 2018-05-27 DIAGNOSIS — E039 Hypothyroidism, unspecified: Secondary | ICD-10-CM | POA: Diagnosis not present

## 2018-05-27 DIAGNOSIS — J449 Chronic obstructive pulmonary disease, unspecified: Secondary | ICD-10-CM | POA: Diagnosis not present

## 2018-05-27 DIAGNOSIS — M4854XD Collapsed vertebra, not elsewhere classified, thoracic region, subsequent encounter for fracture with routine healing: Secondary | ICD-10-CM | POA: Diagnosis not present

## 2018-05-27 DIAGNOSIS — W01198A Fall on same level from slipping, tripping and stumbling with subsequent striking against other object, initial encounter: Secondary | ICD-10-CM | POA: Diagnosis not present

## 2018-05-27 DIAGNOSIS — S299XXA Unspecified injury of thorax, initial encounter: Secondary | ICD-10-CM | POA: Diagnosis not present

## 2018-05-27 DIAGNOSIS — N200 Calculus of kidney: Secondary | ICD-10-CM | POA: Diagnosis not present

## 2018-05-27 DIAGNOSIS — R079 Chest pain, unspecified: Secondary | ICD-10-CM | POA: Diagnosis not present

## 2018-05-27 DIAGNOSIS — N390 Urinary tract infection, site not specified: Secondary | ICD-10-CM | POA: Diagnosis not present

## 2018-05-27 DIAGNOSIS — S20211A Contusion of right front wall of thorax, initial encounter: Secondary | ICD-10-CM | POA: Diagnosis not present

## 2018-05-27 DIAGNOSIS — S20219A Contusion of unspecified front wall of thorax, initial encounter: Secondary | ICD-10-CM | POA: Diagnosis not present

## 2018-05-28 DIAGNOSIS — S20219A Contusion of unspecified front wall of thorax, initial encounter: Secondary | ICD-10-CM | POA: Diagnosis not present

## 2018-05-28 DIAGNOSIS — R791 Abnormal coagulation profile: Secondary | ICD-10-CM | POA: Diagnosis not present

## 2018-05-28 DIAGNOSIS — N200 Calculus of kidney: Secondary | ICD-10-CM | POA: Diagnosis not present

## 2018-05-29 DIAGNOSIS — R1319 Other dysphagia: Secondary | ICD-10-CM | POA: Diagnosis not present

## 2018-05-29 DIAGNOSIS — N390 Urinary tract infection, site not specified: Secondary | ICD-10-CM | POA: Diagnosis not present

## 2018-05-29 DIAGNOSIS — N200 Calculus of kidney: Secondary | ICD-10-CM | POA: Diagnosis present

## 2018-05-29 DIAGNOSIS — S299XXA Unspecified injury of thorax, initial encounter: Secondary | ICD-10-CM | POA: Diagnosis not present

## 2018-05-29 DIAGNOSIS — J449 Chronic obstructive pulmonary disease, unspecified: Secondary | ICD-10-CM | POA: Diagnosis present

## 2018-05-29 DIAGNOSIS — I509 Heart failure, unspecified: Secondary | ICD-10-CM | POA: Diagnosis not present

## 2018-05-29 DIAGNOSIS — B9620 Unspecified Escherichia coli [E. coli] as the cause of diseases classified elsewhere: Secondary | ICD-10-CM | POA: Diagnosis present

## 2018-05-29 DIAGNOSIS — E875 Hyperkalemia: Secondary | ICD-10-CM | POA: Diagnosis not present

## 2018-05-29 DIAGNOSIS — W19XXXA Unspecified fall, initial encounter: Secondary | ICD-10-CM | POA: Diagnosis not present

## 2018-05-29 DIAGNOSIS — E039 Hypothyroidism, unspecified: Secondary | ICD-10-CM | POA: Diagnosis present

## 2018-05-29 DIAGNOSIS — R1310 Dysphagia, unspecified: Secondary | ICD-10-CM | POA: Diagnosis not present

## 2018-05-29 DIAGNOSIS — R41841 Cognitive communication deficit: Secondary | ICD-10-CM | POA: Diagnosis not present

## 2018-05-29 DIAGNOSIS — M810 Age-related osteoporosis without current pathological fracture: Secondary | ICD-10-CM | POA: Diagnosis not present

## 2018-05-29 DIAGNOSIS — B961 Klebsiella pneumoniae [K. pneumoniae] as the cause of diseases classified elsewhere: Secondary | ICD-10-CM | POA: Diagnosis not present

## 2018-05-29 DIAGNOSIS — S20219A Contusion of unspecified front wall of thorax, initial encounter: Secondary | ICD-10-CM | POA: Diagnosis not present

## 2018-05-29 DIAGNOSIS — R2689 Other abnormalities of gait and mobility: Secondary | ICD-10-CM | POA: Diagnosis not present

## 2018-05-29 DIAGNOSIS — M6281 Muscle weakness (generalized): Secondary | ICD-10-CM | POA: Diagnosis not present

## 2018-05-29 DIAGNOSIS — M4854XD Collapsed vertebra, not elsewhere classified, thoracic region, subsequent encounter for fracture with routine healing: Secondary | ICD-10-CM | POA: Diagnosis present

## 2018-05-29 DIAGNOSIS — Z79899 Other long term (current) drug therapy: Secondary | ICD-10-CM | POA: Diagnosis not present

## 2018-06-01 DIAGNOSIS — S20219A Contusion of unspecified front wall of thorax, initial encounter: Secondary | ICD-10-CM | POA: Diagnosis not present

## 2018-06-01 DIAGNOSIS — E039 Hypothyroidism, unspecified: Secondary | ICD-10-CM | POA: Diagnosis not present

## 2018-06-01 DIAGNOSIS — W19XXXA Unspecified fall, initial encounter: Secondary | ICD-10-CM | POA: Diagnosis not present

## 2018-06-01 DIAGNOSIS — R2689 Other abnormalities of gait and mobility: Secondary | ICD-10-CM | POA: Diagnosis not present

## 2018-06-01 DIAGNOSIS — N390 Urinary tract infection, site not specified: Secondary | ICD-10-CM | POA: Diagnosis not present

## 2018-06-01 DIAGNOSIS — R41841 Cognitive communication deficit: Secondary | ICD-10-CM | POA: Diagnosis not present

## 2018-06-01 DIAGNOSIS — S299XXA Unspecified injury of thorax, initial encounter: Secondary | ICD-10-CM | POA: Diagnosis not present

## 2018-06-01 DIAGNOSIS — E875 Hyperkalemia: Secondary | ICD-10-CM | POA: Diagnosis not present

## 2018-06-01 DIAGNOSIS — R1310 Dysphagia, unspecified: Secondary | ICD-10-CM | POA: Diagnosis not present

## 2018-06-01 DIAGNOSIS — S20219D Contusion of unspecified front wall of thorax, subsequent encounter: Secondary | ICD-10-CM | POA: Diagnosis not present

## 2018-06-01 DIAGNOSIS — I509 Heart failure, unspecified: Secondary | ICD-10-CM | POA: Diagnosis not present

## 2018-06-01 DIAGNOSIS — M810 Age-related osteoporosis without current pathological fracture: Secondary | ICD-10-CM | POA: Diagnosis not present

## 2018-06-01 DIAGNOSIS — M6281 Muscle weakness (generalized): Secondary | ICD-10-CM | POA: Diagnosis not present

## 2018-06-01 DIAGNOSIS — R1319 Other dysphagia: Secondary | ICD-10-CM | POA: Diagnosis not present

## 2018-06-02 DIAGNOSIS — S20219A Contusion of unspecified front wall of thorax, initial encounter: Secondary | ICD-10-CM | POA: Diagnosis not present

## 2018-06-13 DIAGNOSIS — S20219D Contusion of unspecified front wall of thorax, subsequent encounter: Secondary | ICD-10-CM | POA: Diagnosis not present

## 2018-07-10 DIAGNOSIS — S20219A Contusion of unspecified front wall of thorax, initial encounter: Secondary | ICD-10-CM | POA: Diagnosis not present

## 2018-07-13 DIAGNOSIS — I700 Atherosclerosis of aorta: Secondary | ICD-10-CM | POA: Diagnosis not present

## 2018-07-13 DIAGNOSIS — M8008XD Age-related osteoporosis with current pathological fracture, vertebra(e), subsequent encounter for fracture with routine healing: Secondary | ICD-10-CM | POA: Diagnosis not present

## 2018-07-13 DIAGNOSIS — K219 Gastro-esophageal reflux disease without esophagitis: Secondary | ICD-10-CM | POA: Diagnosis not present

## 2018-07-13 DIAGNOSIS — Z9181 History of falling: Secondary | ICD-10-CM | POA: Diagnosis not present

## 2018-07-13 DIAGNOSIS — W19XXXD Unspecified fall, subsequent encounter: Secondary | ICD-10-CM | POA: Diagnosis not present

## 2018-07-13 DIAGNOSIS — M519 Unspecified thoracic, thoracolumbar and lumbosacral intervertebral disc disorder: Secondary | ICD-10-CM | POA: Diagnosis not present

## 2018-07-13 DIAGNOSIS — F339 Major depressive disorder, recurrent, unspecified: Secondary | ICD-10-CM | POA: Diagnosis not present

## 2018-07-13 DIAGNOSIS — J449 Chronic obstructive pulmonary disease, unspecified: Secondary | ICD-10-CM | POA: Diagnosis not present

## 2018-07-13 DIAGNOSIS — Z951 Presence of aortocoronary bypass graft: Secondary | ICD-10-CM | POA: Diagnosis not present

## 2018-07-13 DIAGNOSIS — I2510 Atherosclerotic heart disease of native coronary artery without angina pectoris: Secondary | ICD-10-CM | POA: Diagnosis not present

## 2018-07-14 DIAGNOSIS — J449 Chronic obstructive pulmonary disease, unspecified: Secondary | ICD-10-CM | POA: Diagnosis not present

## 2018-07-14 DIAGNOSIS — I2510 Atherosclerotic heart disease of native coronary artery without angina pectoris: Secondary | ICD-10-CM | POA: Diagnosis not present

## 2018-07-14 DIAGNOSIS — M519 Unspecified thoracic, thoracolumbar and lumbosacral intervertebral disc disorder: Secondary | ICD-10-CM | POA: Diagnosis not present

## 2018-07-14 DIAGNOSIS — F339 Major depressive disorder, recurrent, unspecified: Secondary | ICD-10-CM | POA: Diagnosis not present

## 2018-07-14 DIAGNOSIS — K219 Gastro-esophageal reflux disease without esophagitis: Secondary | ICD-10-CM | POA: Diagnosis not present

## 2018-07-14 DIAGNOSIS — M8008XD Age-related osteoporosis with current pathological fracture, vertebra(e), subsequent encounter for fracture with routine healing: Secondary | ICD-10-CM | POA: Diagnosis not present

## 2018-07-15 DIAGNOSIS — M8008XD Age-related osteoporosis with current pathological fracture, vertebra(e), subsequent encounter for fracture with routine healing: Secondary | ICD-10-CM | POA: Diagnosis not present

## 2018-07-15 DIAGNOSIS — K219 Gastro-esophageal reflux disease without esophagitis: Secondary | ICD-10-CM | POA: Diagnosis not present

## 2018-07-15 DIAGNOSIS — F339 Major depressive disorder, recurrent, unspecified: Secondary | ICD-10-CM | POA: Diagnosis not present

## 2018-07-15 DIAGNOSIS — M519 Unspecified thoracic, thoracolumbar and lumbosacral intervertebral disc disorder: Secondary | ICD-10-CM | POA: Diagnosis not present

## 2018-07-15 DIAGNOSIS — J449 Chronic obstructive pulmonary disease, unspecified: Secondary | ICD-10-CM | POA: Diagnosis not present

## 2018-07-15 DIAGNOSIS — I2510 Atherosclerotic heart disease of native coronary artery without angina pectoris: Secondary | ICD-10-CM | POA: Diagnosis not present

## 2018-07-16 DIAGNOSIS — F339 Major depressive disorder, recurrent, unspecified: Secondary | ICD-10-CM | POA: Diagnosis not present

## 2018-07-16 DIAGNOSIS — K219 Gastro-esophageal reflux disease without esophagitis: Secondary | ICD-10-CM | POA: Diagnosis not present

## 2018-07-16 DIAGNOSIS — M519 Unspecified thoracic, thoracolumbar and lumbosacral intervertebral disc disorder: Secondary | ICD-10-CM | POA: Diagnosis not present

## 2018-07-16 DIAGNOSIS — J449 Chronic obstructive pulmonary disease, unspecified: Secondary | ICD-10-CM | POA: Diagnosis not present

## 2018-07-16 DIAGNOSIS — M8008XD Age-related osteoporosis with current pathological fracture, vertebra(e), subsequent encounter for fracture with routine healing: Secondary | ICD-10-CM | POA: Diagnosis not present

## 2018-07-16 DIAGNOSIS — I2510 Atherosclerotic heart disease of native coronary artery without angina pectoris: Secondary | ICD-10-CM | POA: Diagnosis not present

## 2018-07-17 DIAGNOSIS — I2510 Atherosclerotic heart disease of native coronary artery without angina pectoris: Secondary | ICD-10-CM | POA: Diagnosis not present

## 2018-07-17 DIAGNOSIS — F339 Major depressive disorder, recurrent, unspecified: Secondary | ICD-10-CM | POA: Diagnosis not present

## 2018-07-17 DIAGNOSIS — J449 Chronic obstructive pulmonary disease, unspecified: Secondary | ICD-10-CM | POA: Diagnosis not present

## 2018-07-17 DIAGNOSIS — K219 Gastro-esophageal reflux disease without esophagitis: Secondary | ICD-10-CM | POA: Diagnosis not present

## 2018-07-17 DIAGNOSIS — M519 Unspecified thoracic, thoracolumbar and lumbosacral intervertebral disc disorder: Secondary | ICD-10-CM | POA: Diagnosis not present

## 2018-07-17 DIAGNOSIS — M8008XD Age-related osteoporosis with current pathological fracture, vertebra(e), subsequent encounter for fracture with routine healing: Secondary | ICD-10-CM | POA: Diagnosis not present

## 2018-07-20 DIAGNOSIS — F339 Major depressive disorder, recurrent, unspecified: Secondary | ICD-10-CM | POA: Diagnosis not present

## 2018-07-20 DIAGNOSIS — M519 Unspecified thoracic, thoracolumbar and lumbosacral intervertebral disc disorder: Secondary | ICD-10-CM | POA: Diagnosis not present

## 2018-07-20 DIAGNOSIS — M8008XD Age-related osteoporosis with current pathological fracture, vertebra(e), subsequent encounter for fracture with routine healing: Secondary | ICD-10-CM | POA: Diagnosis not present

## 2018-07-20 DIAGNOSIS — K219 Gastro-esophageal reflux disease without esophagitis: Secondary | ICD-10-CM | POA: Diagnosis not present

## 2018-07-20 DIAGNOSIS — J449 Chronic obstructive pulmonary disease, unspecified: Secondary | ICD-10-CM | POA: Diagnosis not present

## 2018-07-20 DIAGNOSIS — I2510 Atherosclerotic heart disease of native coronary artery without angina pectoris: Secondary | ICD-10-CM | POA: Diagnosis not present

## 2018-07-21 DIAGNOSIS — J449 Chronic obstructive pulmonary disease, unspecified: Secondary | ICD-10-CM | POA: Diagnosis not present

## 2018-07-21 DIAGNOSIS — M8008XD Age-related osteoporosis with current pathological fracture, vertebra(e), subsequent encounter for fracture with routine healing: Secondary | ICD-10-CM | POA: Diagnosis not present

## 2018-07-21 DIAGNOSIS — I2510 Atherosclerotic heart disease of native coronary artery without angina pectoris: Secondary | ICD-10-CM | POA: Diagnosis not present

## 2018-07-21 DIAGNOSIS — M519 Unspecified thoracic, thoracolumbar and lumbosacral intervertebral disc disorder: Secondary | ICD-10-CM | POA: Diagnosis not present

## 2018-07-21 DIAGNOSIS — F339 Major depressive disorder, recurrent, unspecified: Secondary | ICD-10-CM | POA: Diagnosis not present

## 2018-07-21 DIAGNOSIS — K219 Gastro-esophageal reflux disease without esophagitis: Secondary | ICD-10-CM | POA: Diagnosis not present

## 2018-07-22 DIAGNOSIS — F339 Major depressive disorder, recurrent, unspecified: Secondary | ICD-10-CM | POA: Diagnosis not present

## 2018-07-22 DIAGNOSIS — K219 Gastro-esophageal reflux disease without esophagitis: Secondary | ICD-10-CM | POA: Diagnosis not present

## 2018-07-22 DIAGNOSIS — I2510 Atherosclerotic heart disease of native coronary artery without angina pectoris: Secondary | ICD-10-CM | POA: Diagnosis not present

## 2018-07-22 DIAGNOSIS — J449 Chronic obstructive pulmonary disease, unspecified: Secondary | ICD-10-CM | POA: Diagnosis not present

## 2018-07-22 DIAGNOSIS — M519 Unspecified thoracic, thoracolumbar and lumbosacral intervertebral disc disorder: Secondary | ICD-10-CM | POA: Diagnosis not present

## 2018-07-22 DIAGNOSIS — M8008XD Age-related osteoporosis with current pathological fracture, vertebra(e), subsequent encounter for fracture with routine healing: Secondary | ICD-10-CM | POA: Diagnosis not present

## 2018-07-23 DIAGNOSIS — J449 Chronic obstructive pulmonary disease, unspecified: Secondary | ICD-10-CM | POA: Diagnosis not present

## 2018-07-23 DIAGNOSIS — M519 Unspecified thoracic, thoracolumbar and lumbosacral intervertebral disc disorder: Secondary | ICD-10-CM | POA: Diagnosis not present

## 2018-07-23 DIAGNOSIS — K219 Gastro-esophageal reflux disease without esophagitis: Secondary | ICD-10-CM | POA: Diagnosis not present

## 2018-07-23 DIAGNOSIS — I2510 Atherosclerotic heart disease of native coronary artery without angina pectoris: Secondary | ICD-10-CM | POA: Diagnosis not present

## 2018-07-23 DIAGNOSIS — M8008XD Age-related osteoporosis with current pathological fracture, vertebra(e), subsequent encounter for fracture with routine healing: Secondary | ICD-10-CM | POA: Diagnosis not present

## 2018-07-23 DIAGNOSIS — F339 Major depressive disorder, recurrent, unspecified: Secondary | ICD-10-CM | POA: Diagnosis not present

## 2018-07-24 DIAGNOSIS — I700 Atherosclerosis of aorta: Secondary | ICD-10-CM | POA: Diagnosis not present

## 2018-07-24 DIAGNOSIS — J449 Chronic obstructive pulmonary disease, unspecified: Secondary | ICD-10-CM | POA: Diagnosis not present

## 2018-07-24 DIAGNOSIS — I2510 Atherosclerotic heart disease of native coronary artery without angina pectoris: Secondary | ICD-10-CM | POA: Diagnosis not present

## 2018-07-24 DIAGNOSIS — M519 Unspecified thoracic, thoracolumbar and lumbosacral intervertebral disc disorder: Secondary | ICD-10-CM | POA: Diagnosis not present

## 2018-07-24 DIAGNOSIS — F339 Major depressive disorder, recurrent, unspecified: Secondary | ICD-10-CM | POA: Diagnosis not present

## 2018-07-24 DIAGNOSIS — Z951 Presence of aortocoronary bypass graft: Secondary | ICD-10-CM | POA: Diagnosis not present

## 2018-07-24 DIAGNOSIS — M8008XD Age-related osteoporosis with current pathological fracture, vertebra(e), subsequent encounter for fracture with routine healing: Secondary | ICD-10-CM | POA: Diagnosis not present

## 2018-07-24 DIAGNOSIS — K219 Gastro-esophageal reflux disease without esophagitis: Secondary | ICD-10-CM | POA: Diagnosis not present

## 2018-07-28 DIAGNOSIS — K219 Gastro-esophageal reflux disease without esophagitis: Secondary | ICD-10-CM | POA: Diagnosis not present

## 2018-07-28 DIAGNOSIS — I2510 Atherosclerotic heart disease of native coronary artery without angina pectoris: Secondary | ICD-10-CM | POA: Diagnosis not present

## 2018-07-28 DIAGNOSIS — M8008XD Age-related osteoporosis with current pathological fracture, vertebra(e), subsequent encounter for fracture with routine healing: Secondary | ICD-10-CM | POA: Diagnosis not present

## 2018-07-28 DIAGNOSIS — F339 Major depressive disorder, recurrent, unspecified: Secondary | ICD-10-CM | POA: Diagnosis not present

## 2018-07-28 DIAGNOSIS — J449 Chronic obstructive pulmonary disease, unspecified: Secondary | ICD-10-CM | POA: Diagnosis not present

## 2018-07-28 DIAGNOSIS — M519 Unspecified thoracic, thoracolumbar and lumbosacral intervertebral disc disorder: Secondary | ICD-10-CM | POA: Diagnosis not present

## 2018-07-29 DIAGNOSIS — I2510 Atherosclerotic heart disease of native coronary artery without angina pectoris: Secondary | ICD-10-CM | POA: Diagnosis not present

## 2018-07-29 DIAGNOSIS — J449 Chronic obstructive pulmonary disease, unspecified: Secondary | ICD-10-CM | POA: Diagnosis not present

## 2018-07-29 DIAGNOSIS — M8008XD Age-related osteoporosis with current pathological fracture, vertebra(e), subsequent encounter for fracture with routine healing: Secondary | ICD-10-CM | POA: Diagnosis not present

## 2018-07-29 DIAGNOSIS — K219 Gastro-esophageal reflux disease without esophagitis: Secondary | ICD-10-CM | POA: Diagnosis not present

## 2018-07-29 DIAGNOSIS — F339 Major depressive disorder, recurrent, unspecified: Secondary | ICD-10-CM | POA: Diagnosis not present

## 2018-07-29 DIAGNOSIS — M519 Unspecified thoracic, thoracolumbar and lumbosacral intervertebral disc disorder: Secondary | ICD-10-CM | POA: Diagnosis not present

## 2018-07-31 DIAGNOSIS — I2510 Atherosclerotic heart disease of native coronary artery without angina pectoris: Secondary | ICD-10-CM | POA: Diagnosis not present

## 2018-07-31 DIAGNOSIS — F339 Major depressive disorder, recurrent, unspecified: Secondary | ICD-10-CM | POA: Diagnosis not present

## 2018-07-31 DIAGNOSIS — J449 Chronic obstructive pulmonary disease, unspecified: Secondary | ICD-10-CM | POA: Diagnosis not present

## 2018-07-31 DIAGNOSIS — K219 Gastro-esophageal reflux disease without esophagitis: Secondary | ICD-10-CM | POA: Diagnosis not present

## 2018-07-31 DIAGNOSIS — M8008XD Age-related osteoporosis with current pathological fracture, vertebra(e), subsequent encounter for fracture with routine healing: Secondary | ICD-10-CM | POA: Diagnosis not present

## 2018-07-31 DIAGNOSIS — M519 Unspecified thoracic, thoracolumbar and lumbosacral intervertebral disc disorder: Secondary | ICD-10-CM | POA: Diagnosis not present

## 2018-08-05 IMAGING — CR DG CHEST 2V
2 series · 2 of 2 positions shown · non-contrast
Comparison: Radiographs February 10, 2018. CT scan February 20, 2018.

CLINICAL DATA: Protruding sternal wires.

EXAM:
CHEST - 2 VIEW

[w chest pa]
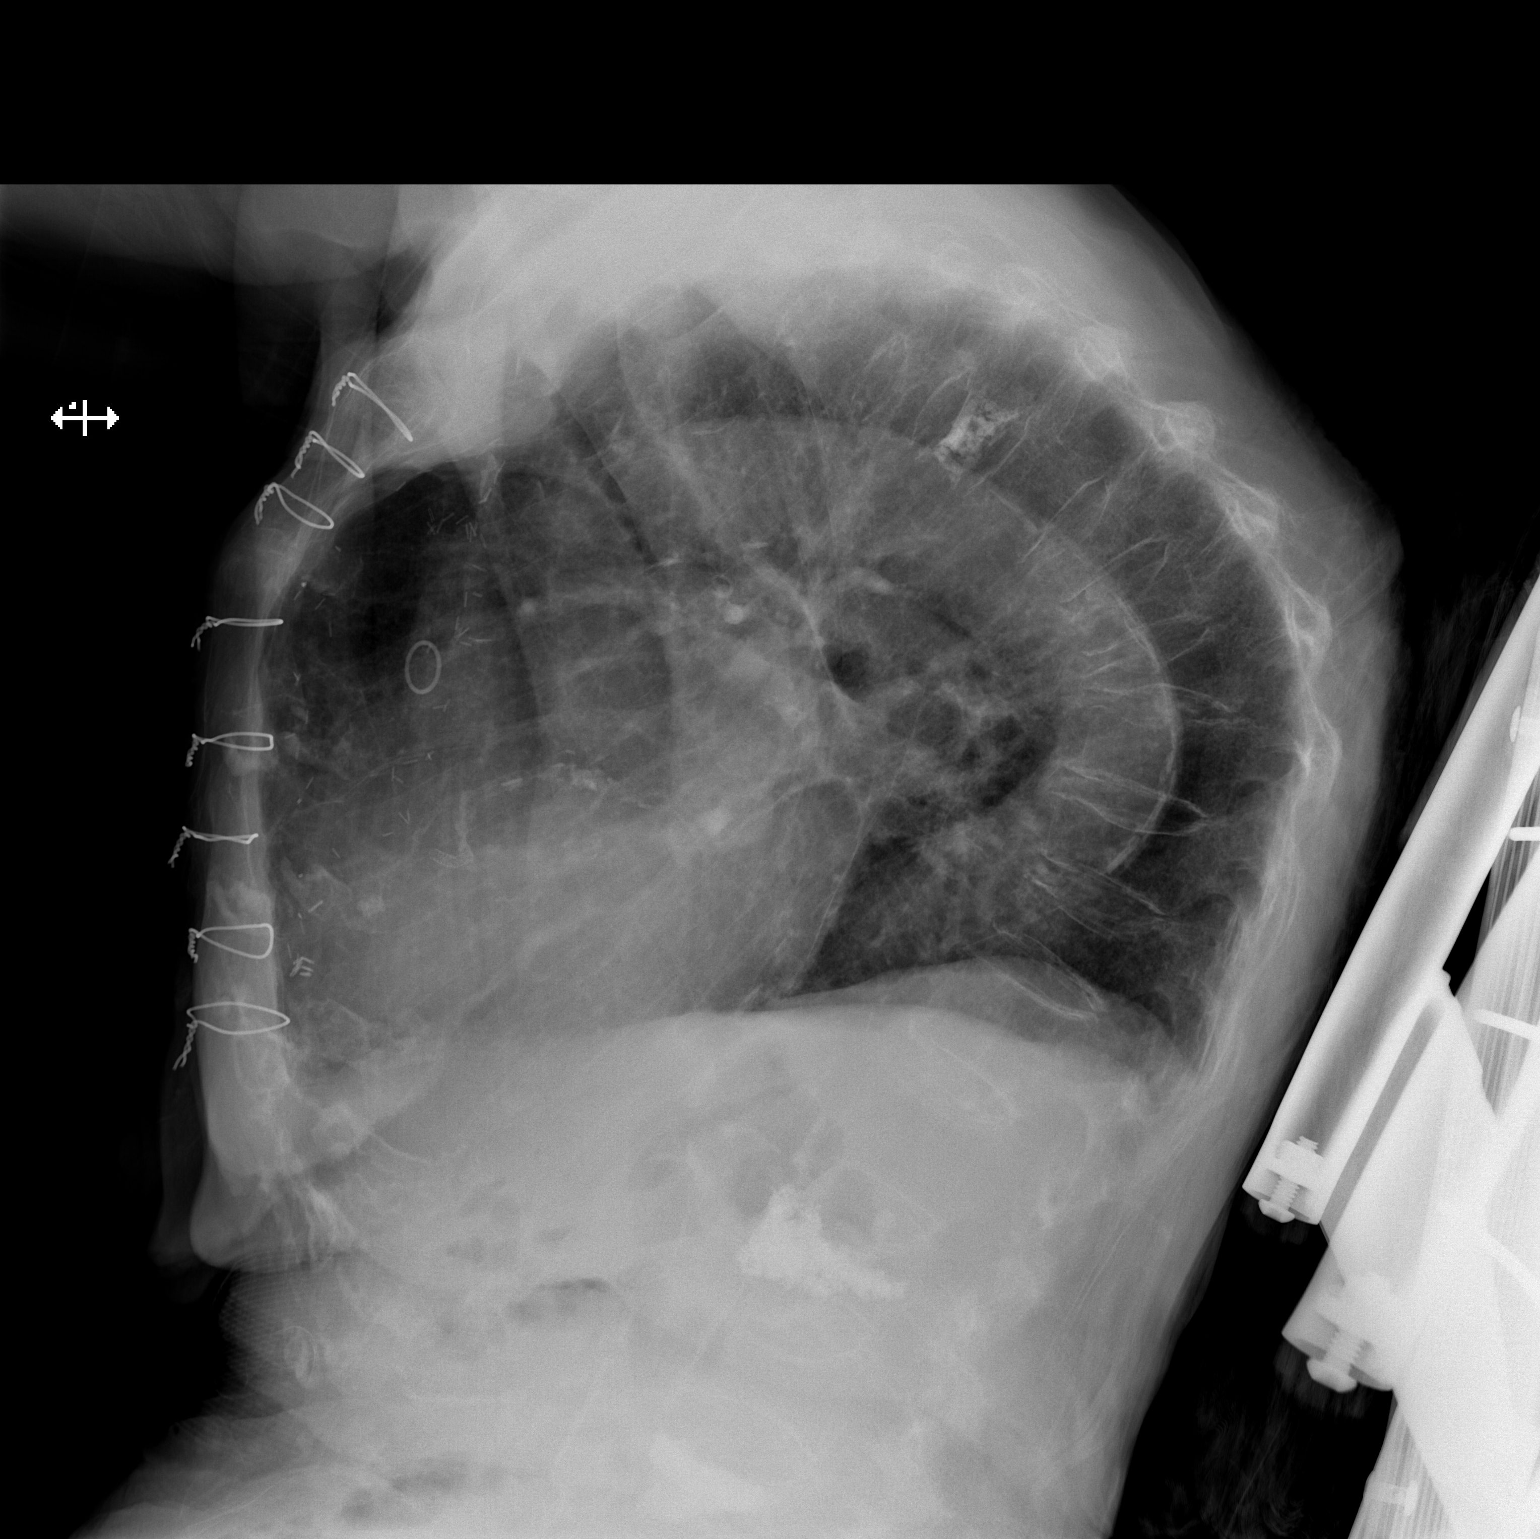

[x chest ap]
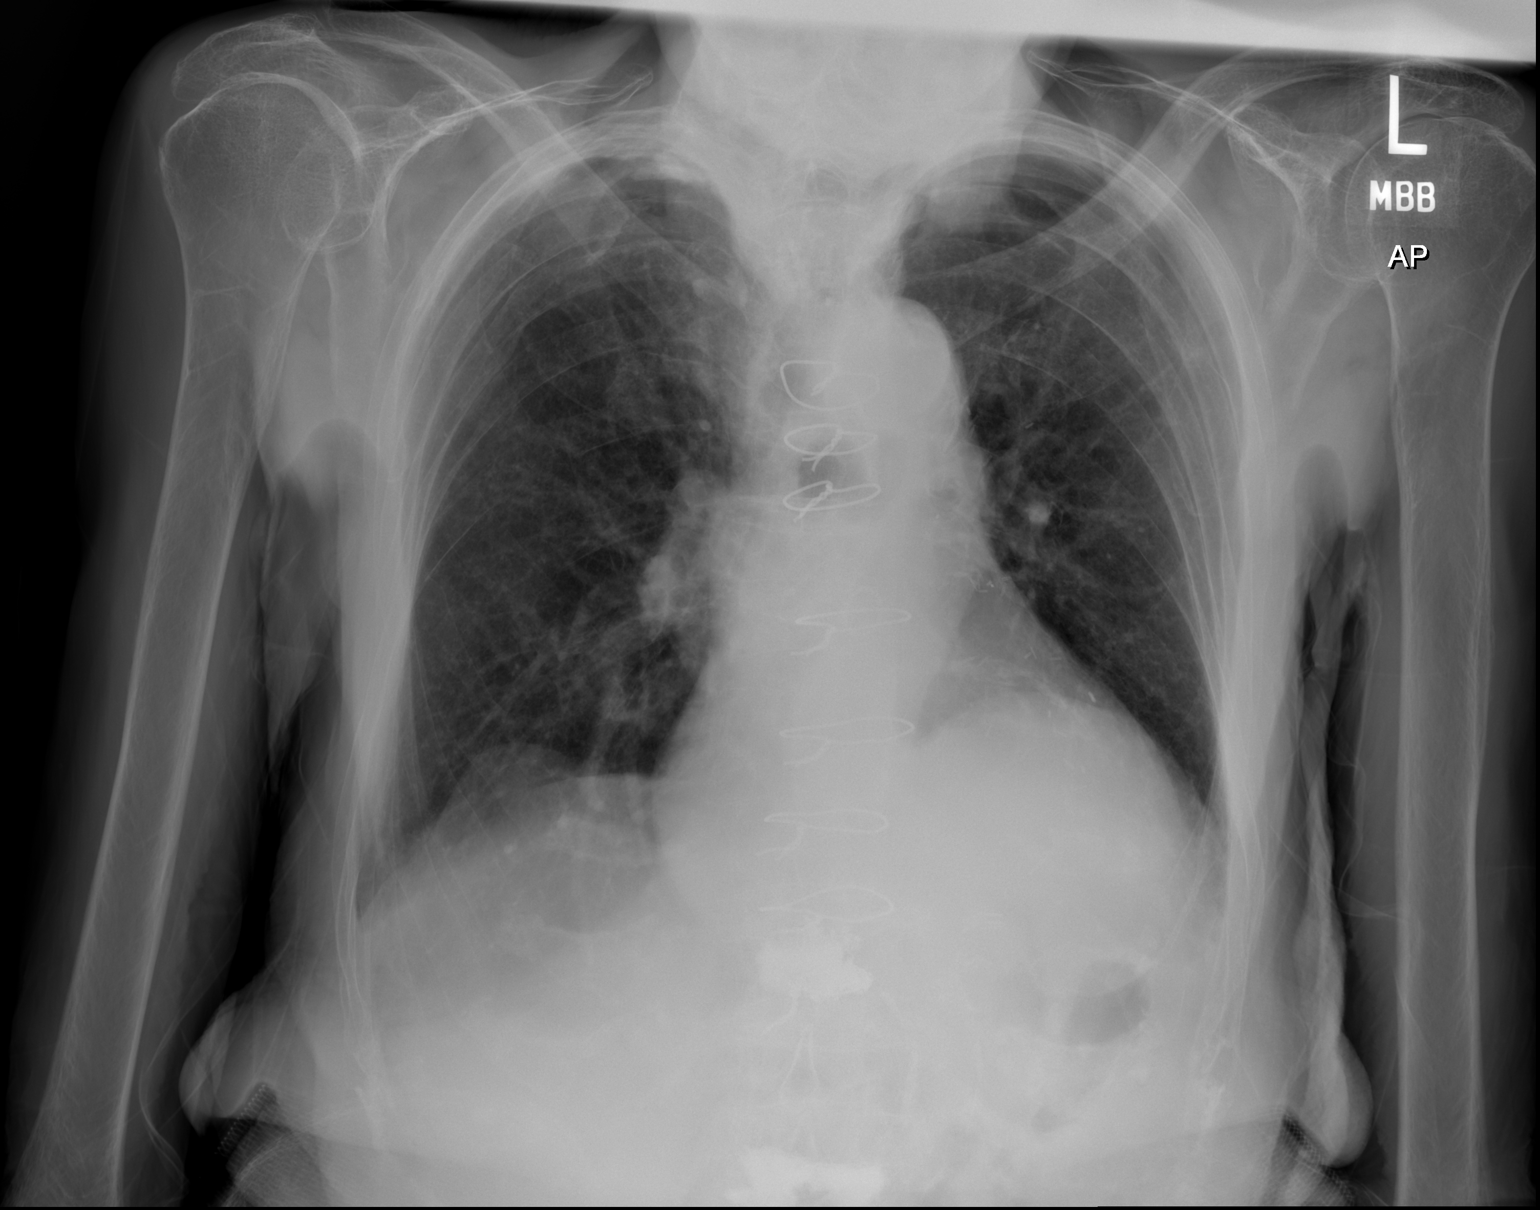

[2 of 2 positions shown; findings below may reference images not displayed]

FINDINGS: Stable cardiomediastinal silhouette. Atherosclerosis of thoracic
aorta is noted. Status post coronary artery bypass graft. Sternotomy
wires are noted and appear to be well positioned. No acute pulmonary
disease is noted. Stable old thoracic compression fractures are
noted.
IMPRESSION: No active cardiopulmonary disease.

Aortic Atherosclerosis ([id number]).

## 2018-08-10 DIAGNOSIS — R634 Abnormal weight loss: Secondary | ICD-10-CM | POA: Diagnosis not present

## 2018-08-10 DIAGNOSIS — Z6821 Body mass index (BMI) 21.0-21.9, adult: Secondary | ICD-10-CM | POA: Diagnosis not present

## 2018-08-10 DIAGNOSIS — M810 Age-related osteoporosis without current pathological fracture: Secondary | ICD-10-CM | POA: Diagnosis not present

## 2018-08-10 DIAGNOSIS — M4850XA Collapsed vertebra, not elsewhere classified, site unspecified, initial encounter for fracture: Secondary | ICD-10-CM | POA: Diagnosis not present

## 2018-08-14 DIAGNOSIS — I2510 Atherosclerotic heart disease of native coronary artery without angina pectoris: Secondary | ICD-10-CM | POA: Diagnosis not present

## 2018-08-14 DIAGNOSIS — M519 Unspecified thoracic, thoracolumbar and lumbosacral intervertebral disc disorder: Secondary | ICD-10-CM | POA: Diagnosis not present

## 2018-08-14 DIAGNOSIS — J449 Chronic obstructive pulmonary disease, unspecified: Secondary | ICD-10-CM | POA: Diagnosis not present

## 2018-08-14 DIAGNOSIS — M8008XD Age-related osteoporosis with current pathological fracture, vertebra(e), subsequent encounter for fracture with routine healing: Secondary | ICD-10-CM | POA: Diagnosis not present

## 2018-08-14 DIAGNOSIS — K219 Gastro-esophageal reflux disease without esophagitis: Secondary | ICD-10-CM | POA: Diagnosis not present

## 2018-08-14 DIAGNOSIS — F339 Major depressive disorder, recurrent, unspecified: Secondary | ICD-10-CM | POA: Diagnosis not present

## 2018-08-25 DIAGNOSIS — I2510 Atherosclerotic heart disease of native coronary artery without angina pectoris: Secondary | ICD-10-CM | POA: Diagnosis not present

## 2018-08-25 DIAGNOSIS — M519 Unspecified thoracic, thoracolumbar and lumbosacral intervertebral disc disorder: Secondary | ICD-10-CM | POA: Diagnosis not present

## 2018-08-25 DIAGNOSIS — F339 Major depressive disorder, recurrent, unspecified: Secondary | ICD-10-CM | POA: Diagnosis not present

## 2018-08-25 DIAGNOSIS — J449 Chronic obstructive pulmonary disease, unspecified: Secondary | ICD-10-CM | POA: Diagnosis not present

## 2018-08-25 DIAGNOSIS — K219 Gastro-esophageal reflux disease without esophagitis: Secondary | ICD-10-CM | POA: Diagnosis not present

## 2018-08-25 DIAGNOSIS — M8008XD Age-related osteoporosis with current pathological fracture, vertebra(e), subsequent encounter for fracture with routine healing: Secondary | ICD-10-CM | POA: Diagnosis not present

## 2018-09-08 DIAGNOSIS — I2510 Atherosclerotic heart disease of native coronary artery without angina pectoris: Secondary | ICD-10-CM | POA: Diagnosis not present

## 2018-09-08 DIAGNOSIS — M8008XD Age-related osteoporosis with current pathological fracture, vertebra(e), subsequent encounter for fracture with routine healing: Secondary | ICD-10-CM | POA: Diagnosis not present

## 2018-09-08 DIAGNOSIS — F339 Major depressive disorder, recurrent, unspecified: Secondary | ICD-10-CM | POA: Diagnosis not present

## 2018-09-08 DIAGNOSIS — J449 Chronic obstructive pulmonary disease, unspecified: Secondary | ICD-10-CM | POA: Diagnosis not present

## 2018-09-08 DIAGNOSIS — M519 Unspecified thoracic, thoracolumbar and lumbosacral intervertebral disc disorder: Secondary | ICD-10-CM | POA: Diagnosis not present

## 2018-09-08 DIAGNOSIS — K219 Gastro-esophageal reflux disease without esophagitis: Secondary | ICD-10-CM | POA: Diagnosis not present

## 2018-09-10 DIAGNOSIS — Z23 Encounter for immunization: Secondary | ICD-10-CM | POA: Diagnosis not present

## 2018-09-11 DIAGNOSIS — W19XXXD Unspecified fall, subsequent encounter: Secondary | ICD-10-CM | POA: Diagnosis not present

## 2018-09-11 DIAGNOSIS — J449 Chronic obstructive pulmonary disease, unspecified: Secondary | ICD-10-CM | POA: Diagnosis not present

## 2018-09-11 DIAGNOSIS — Z9181 History of falling: Secondary | ICD-10-CM | POA: Diagnosis not present

## 2018-09-11 DIAGNOSIS — I700 Atherosclerosis of aorta: Secondary | ICD-10-CM | POA: Diagnosis not present

## 2018-09-11 DIAGNOSIS — F339 Major depressive disorder, recurrent, unspecified: Secondary | ICD-10-CM | POA: Diagnosis not present

## 2018-09-11 DIAGNOSIS — K219 Gastro-esophageal reflux disease without esophagitis: Secondary | ICD-10-CM | POA: Diagnosis not present

## 2018-09-11 DIAGNOSIS — I2510 Atherosclerotic heart disease of native coronary artery without angina pectoris: Secondary | ICD-10-CM | POA: Diagnosis not present

## 2018-09-11 DIAGNOSIS — S81801D Unspecified open wound, right lower leg, subsequent encounter: Secondary | ICD-10-CM | POA: Diagnosis not present

## 2018-09-11 DIAGNOSIS — M8008XD Age-related osteoporosis with current pathological fracture, vertebra(e), subsequent encounter for fracture with routine healing: Secondary | ICD-10-CM | POA: Diagnosis not present

## 2018-09-11 DIAGNOSIS — Z951 Presence of aortocoronary bypass graft: Secondary | ICD-10-CM | POA: Diagnosis not present

## 2018-09-11 DIAGNOSIS — M519 Unspecified thoracic, thoracolumbar and lumbosacral intervertebral disc disorder: Secondary | ICD-10-CM | POA: Diagnosis not present

## 2018-09-17 DIAGNOSIS — S81801D Unspecified open wound, right lower leg, subsequent encounter: Secondary | ICD-10-CM | POA: Diagnosis not present

## 2018-09-17 DIAGNOSIS — M8008XD Age-related osteoporosis with current pathological fracture, vertebra(e), subsequent encounter for fracture with routine healing: Secondary | ICD-10-CM | POA: Diagnosis not present

## 2018-09-17 DIAGNOSIS — F339 Major depressive disorder, recurrent, unspecified: Secondary | ICD-10-CM | POA: Diagnosis not present

## 2018-09-17 DIAGNOSIS — J449 Chronic obstructive pulmonary disease, unspecified: Secondary | ICD-10-CM | POA: Diagnosis not present

## 2018-09-17 DIAGNOSIS — I2510 Atherosclerotic heart disease of native coronary artery without angina pectoris: Secondary | ICD-10-CM | POA: Diagnosis not present

## 2018-09-17 DIAGNOSIS — M519 Unspecified thoracic, thoracolumbar and lumbosacral intervertebral disc disorder: Secondary | ICD-10-CM | POA: Diagnosis not present

## 2018-09-22 DIAGNOSIS — J449 Chronic obstructive pulmonary disease, unspecified: Secondary | ICD-10-CM | POA: Diagnosis not present

## 2018-09-22 DIAGNOSIS — I2510 Atherosclerotic heart disease of native coronary artery without angina pectoris: Secondary | ICD-10-CM | POA: Diagnosis not present

## 2018-09-22 DIAGNOSIS — F339 Major depressive disorder, recurrent, unspecified: Secondary | ICD-10-CM | POA: Diagnosis not present

## 2018-09-22 DIAGNOSIS — M8008XD Age-related osteoporosis with current pathological fracture, vertebra(e), subsequent encounter for fracture with routine healing: Secondary | ICD-10-CM | POA: Diagnosis not present

## 2018-09-22 DIAGNOSIS — S81801D Unspecified open wound, right lower leg, subsequent encounter: Secondary | ICD-10-CM | POA: Diagnosis not present

## 2018-09-22 DIAGNOSIS — M519 Unspecified thoracic, thoracolumbar and lumbosacral intervertebral disc disorder: Secondary | ICD-10-CM | POA: Diagnosis not present

## 2018-09-28 DIAGNOSIS — M519 Unspecified thoracic, thoracolumbar and lumbosacral intervertebral disc disorder: Secondary | ICD-10-CM | POA: Diagnosis not present

## 2018-09-28 DIAGNOSIS — F339 Major depressive disorder, recurrent, unspecified: Secondary | ICD-10-CM | POA: Diagnosis not present

## 2018-09-28 DIAGNOSIS — S81801D Unspecified open wound, right lower leg, subsequent encounter: Secondary | ICD-10-CM | POA: Diagnosis not present

## 2018-09-28 DIAGNOSIS — J449 Chronic obstructive pulmonary disease, unspecified: Secondary | ICD-10-CM | POA: Diagnosis not present

## 2018-09-28 DIAGNOSIS — I2510 Atherosclerotic heart disease of native coronary artery without angina pectoris: Secondary | ICD-10-CM | POA: Diagnosis not present

## 2018-09-28 DIAGNOSIS — M8008XD Age-related osteoporosis with current pathological fracture, vertebra(e), subsequent encounter for fracture with routine healing: Secondary | ICD-10-CM | POA: Diagnosis not present

## 2018-10-06 DIAGNOSIS — S81801D Unspecified open wound, right lower leg, subsequent encounter: Secondary | ICD-10-CM | POA: Diagnosis not present

## 2018-10-06 DIAGNOSIS — M519 Unspecified thoracic, thoracolumbar and lumbosacral intervertebral disc disorder: Secondary | ICD-10-CM | POA: Diagnosis not present

## 2018-10-06 DIAGNOSIS — M8008XD Age-related osteoporosis with current pathological fracture, vertebra(e), subsequent encounter for fracture with routine healing: Secondary | ICD-10-CM | POA: Diagnosis not present

## 2018-10-06 DIAGNOSIS — J449 Chronic obstructive pulmonary disease, unspecified: Secondary | ICD-10-CM | POA: Diagnosis not present

## 2018-10-06 DIAGNOSIS — I2510 Atherosclerotic heart disease of native coronary artery without angina pectoris: Secondary | ICD-10-CM | POA: Diagnosis not present

## 2018-10-06 DIAGNOSIS — F339 Major depressive disorder, recurrent, unspecified: Secondary | ICD-10-CM | POA: Diagnosis not present

## 2018-10-09 DIAGNOSIS — I2510 Atherosclerotic heart disease of native coronary artery without angina pectoris: Secondary | ICD-10-CM | POA: Diagnosis not present

## 2018-10-09 DIAGNOSIS — J449 Chronic obstructive pulmonary disease, unspecified: Secondary | ICD-10-CM | POA: Diagnosis not present

## 2018-10-09 DIAGNOSIS — S81801D Unspecified open wound, right lower leg, subsequent encounter: Secondary | ICD-10-CM | POA: Diagnosis not present

## 2018-10-09 DIAGNOSIS — M8008XD Age-related osteoporosis with current pathological fracture, vertebra(e), subsequent encounter for fracture with routine healing: Secondary | ICD-10-CM | POA: Diagnosis not present

## 2018-10-09 DIAGNOSIS — M519 Unspecified thoracic, thoracolumbar and lumbosacral intervertebral disc disorder: Secondary | ICD-10-CM | POA: Diagnosis not present

## 2018-10-09 DIAGNOSIS — F339 Major depressive disorder, recurrent, unspecified: Secondary | ICD-10-CM | POA: Diagnosis not present

## 2018-10-12 DIAGNOSIS — F339 Major depressive disorder, recurrent, unspecified: Secondary | ICD-10-CM | POA: Diagnosis not present

## 2018-10-12 DIAGNOSIS — I2510 Atherosclerotic heart disease of native coronary artery without angina pectoris: Secondary | ICD-10-CM | POA: Diagnosis not present

## 2018-10-12 DIAGNOSIS — S81801D Unspecified open wound, right lower leg, subsequent encounter: Secondary | ICD-10-CM | POA: Diagnosis not present

## 2018-10-12 DIAGNOSIS — M8008XD Age-related osteoporosis with current pathological fracture, vertebra(e), subsequent encounter for fracture with routine healing: Secondary | ICD-10-CM | POA: Diagnosis not present

## 2018-10-12 DIAGNOSIS — M519 Unspecified thoracic, thoracolumbar and lumbosacral intervertebral disc disorder: Secondary | ICD-10-CM | POA: Diagnosis not present

## 2018-10-12 DIAGNOSIS — J449 Chronic obstructive pulmonary disease, unspecified: Secondary | ICD-10-CM | POA: Diagnosis not present

## 2018-10-13 DIAGNOSIS — E039 Hypothyroidism, unspecified: Secondary | ICD-10-CM | POA: Diagnosis not present

## 2018-10-13 DIAGNOSIS — Z6822 Body mass index (BMI) 22.0-22.9, adult: Secondary | ICD-10-CM | POA: Diagnosis not present

## 2018-10-13 DIAGNOSIS — M129 Arthropathy, unspecified: Secondary | ICD-10-CM | POA: Diagnosis not present

## 2018-10-13 DIAGNOSIS — N393 Stress incontinence (female) (male): Secondary | ICD-10-CM | POA: Diagnosis not present

## 2018-10-13 DIAGNOSIS — Z0001 Encounter for general adult medical examination with abnormal findings: Secondary | ICD-10-CM | POA: Diagnosis not present

## 2018-10-16 NOTE — Progress Notes (Deleted)
Cardiology Office Note  Date: 10/16/2018   ID: Vaughn, Carmen Dec 17, 1919, MRN [id number]  PCP: Rory Percy, MD  Primary Cardiologist: Rozann Lesches, MD   No chief complaint on file.   History of Present Illness: Carmen Vaughn is a 84 y.o. female last seen in March.  Interval records indicate evaluation by Dr. Prescott Gum and ultimately removal of a protruding sternal wire in April.  Past Medical History:  Diagnosis Date  . Aortic stenosis    Mild, 06/2012  . Arthritis   . COPD (chronic obstructive pulmonary disease) ([place])   . Coronary atherosclerosis of native coronary artery    Multivessel status post CABG  . Dyspnea    on exertion  . Essential hypertension, benign   . GERD (gastroesophageal reflux disease)   . History of kidney stones   . Hypothyroidism   . Mitral regurgitation    Moderate, 06/2012  . Mixed hyperlipidemia    Statin intolerant    Past Surgical History:  Procedure Laterality Date  . ABDOMINAL HYSTERECTOMY    . CORONARY ARTERY BYPASS GRAFT  2002   LIMA to LAD, SVG to diagonal  . KYPHOPLASTY     two times  . STERNAL WIRES REMOVAL N/A 03/13/2018   Procedure: STERNAL WIRES REMOVAL;  Surgeon: Ivin Poot, MD;  Location: [facility];  Service: Thoracic;  Laterality: N/A;    Current Outpatient Medications  Medication Sig Dispense Refill  . albuterol (PROVENTIL) (2.5 MG/3ML) 0.083% nebulizer solution Take 2.5 mg by nebulization 4 (four) times daily.     . Cholecalciferol (VITAMIN D3) 1000 units CAPS Take 1 capsule by mouth daily.    . diclofenac sodium (VOLTAREN) 1 % GEL Apply 1 application topically 4 (four) times daily as needed (PAIN).     . feeding supplement (BOOST / RESOURCE BREEZE) LIQD Take 1 Container by mouth 2 (two) times daily as needed.     . furosemide (LASIX) 20 MG tablet Take 1 tablet (20 mg total) by mouth as needed (For 2lb weight gain in 24 hours). (Patient taking differently: Take 40 mg by mouth as needed (For 2lb weight gain in  24 hours). ) 90 tablet 1  . HYDROcodone-acetaminophen (NORCO) 10-325 MG tablet Take 1 tablet by mouth 3 (three) times daily as needed for severe pain.     Marland Kitchen levothyroxine (SYNTHROID, LEVOTHROID) 100 MCG tablet Take 100 mcg by mouth daily before breakfast.     . PROAIR HFA 108 (90 BASE) MCG/ACT inhaler Inhale 2 puffs into the lungs 3 (three) times daily.     . raloxifene (EVISTA) 60 MG tablet Take 60 mg by mouth daily.       No current facility-administered medications for this visit.    Allergies:  Nitroglycerin; Penicillins; Statins; and Aleve [naproxen]   Social History: The patient  reports that she is a non-smoker but has been exposed to tobacco smoke. She has never used smokeless tobacco. She reports that she does not drink alcohol or use drugs.   Family History: The patient's family history is not on file.   ROS:  Please see the history of present illness. Otherwise, complete review of systems is positive for {NONE DEFAULTED:18576::"none"}.  All other systems are reviewed and negative.   Physical Exam: VS:  There were no vitals taken for this visit., BMI There is no height or weight on file to calculate BMI.  Wt Readings from Last 3 Encounters:  03/13/18 117 lb (53.1 kg)  03/12/18 117 lb (53.1  kg)  03/06/18 116 lb (52.6 kg)    General: Patient appears comfortable at rest. HEENT: Conjunctiva and lids normal, oropharynx clear with moist mucosa. Neck: Supple, no elevated JVP or carotid bruits, no thyromegaly. Lungs: Clear to auscultation, nonlabored breathing at rest. Cardiac: Regular rate and rhythm, no S3 or significant systolic murmur, no pericardial rub. Abdomen: Soft, nontender, no hepatomegaly, bowel sounds present, no guarding or rebound. Extremities: No pitting edema, distal pulses 2+. Skin: Warm and dry. Musculoskeletal: No kyphosis. Neuropsychiatric: Alert and oriented x3, affect grossly appropriate.  ECG: I personally reviewed the tracing from 03/12/2018 which showed  rate controlled atrial fibrillation.  Recent Labwork: 03/12/2018: ALT 18; AST 23; BUN 23; Creatinine, Ser 0.93; Hemoglobin 11.6; Platelets 132; Potassium 4.3; Sodium 139   Other Studies Reviewed Today:  Echocardiogram 06/04/2017 [facility]): Mild LVH with LVEF 00-92%, grade 3 diastolic dysfunction, mild left atrial enlargement, normal right ventricular contraction, mildly dilated right atrium with secundum ASD and left to right shunt, sclerotic aortic valve with mild aortic regurgitation but no stenosis, moderately thickened mitral valve with moderate to severe mitral stenosis, mild to moderate tricuspid regurgitation with RVSP 30 mmHg.  Assessment and Plan:    Current medicines were reviewed with the patient today.  No orders of the defined types were placed in this encounter.   Disposition:  Signed, Satira Sark, MD, [facility] 10/16/2018 3:15 PM    [place] [address], [place], [place] [address] Phone: [phone]; Fax: [phone]

## 2018-10-19 ENCOUNTER — Ambulatory Visit: Payer: Medicare Other | Admitting: Cardiology

## 2018-10-20 ENCOUNTER — Encounter: Payer: Self-pay | Admitting: Cardiology

## 2018-10-20 DIAGNOSIS — F339 Major depressive disorder, recurrent, unspecified: Secondary | ICD-10-CM | POA: Diagnosis not present

## 2018-10-20 DIAGNOSIS — J449 Chronic obstructive pulmonary disease, unspecified: Secondary | ICD-10-CM | POA: Diagnosis not present

## 2018-10-20 DIAGNOSIS — S81801D Unspecified open wound, right lower leg, subsequent encounter: Secondary | ICD-10-CM | POA: Diagnosis not present

## 2018-10-20 DIAGNOSIS — I2510 Atherosclerotic heart disease of native coronary artery without angina pectoris: Secondary | ICD-10-CM | POA: Diagnosis not present

## 2018-10-20 DIAGNOSIS — M519 Unspecified thoracic, thoracolumbar and lumbosacral intervertebral disc disorder: Secondary | ICD-10-CM | POA: Diagnosis not present

## 2018-10-20 DIAGNOSIS — M8008XD Age-related osteoporosis with current pathological fracture, vertebra(e), subsequent encounter for fracture with routine healing: Secondary | ICD-10-CM | POA: Diagnosis not present

## 2018-10-28 DIAGNOSIS — S81801D Unspecified open wound, right lower leg, subsequent encounter: Secondary | ICD-10-CM | POA: Diagnosis not present

## 2018-10-28 DIAGNOSIS — I2510 Atherosclerotic heart disease of native coronary artery without angina pectoris: Secondary | ICD-10-CM | POA: Diagnosis not present

## 2018-10-28 DIAGNOSIS — M8008XD Age-related osteoporosis with current pathological fracture, vertebra(e), subsequent encounter for fracture with routine healing: Secondary | ICD-10-CM | POA: Diagnosis not present

## 2018-10-28 DIAGNOSIS — F339 Major depressive disorder, recurrent, unspecified: Secondary | ICD-10-CM | POA: Diagnosis not present

## 2018-10-28 DIAGNOSIS — J449 Chronic obstructive pulmonary disease, unspecified: Secondary | ICD-10-CM | POA: Diagnosis not present

## 2018-10-28 DIAGNOSIS — M519 Unspecified thoracic, thoracolumbar and lumbosacral intervertebral disc disorder: Secondary | ICD-10-CM | POA: Diagnosis not present

## 2018-11-03 ENCOUNTER — Telehealth: Payer: Self-pay | Admitting: Cardiology

## 2018-11-03 NOTE — Telephone Encounter (Signed)
Son called reference to his mom having problems with retaining fluid and SOB

## 2018-11-03 NOTE — Telephone Encounter (Signed)
Pt c/o 4lbs weight gain since last night - took 20 mg of lasix this morning - pt denies increased SOB/dizziness - some swelling in her legs/ankles/feet - was told to contact us for weight gain over 2lbs overnight - pt has f/u in January - pt says she will take another lasix tomorrow if weight doesn't drop

## 2018-11-23 DIAGNOSIS — Z7722 Contact with and (suspected) exposure to environmental tobacco smoke (acute) (chronic): Secondary | ICD-10-CM | POA: Diagnosis present

## 2018-11-23 DIAGNOSIS — M810 Age-related osteoporosis without current pathological fracture: Secondary | ICD-10-CM | POA: Diagnosis not present

## 2018-11-23 DIAGNOSIS — T380X5A Adverse effect of glucocorticoids and synthetic analogues, initial encounter: Secondary | ICD-10-CM | POA: Diagnosis not present

## 2018-11-23 DIAGNOSIS — Z681 Body mass index (BMI) 19 or less, adult: Secondary | ICD-10-CM | POA: Diagnosis not present

## 2018-11-23 DIAGNOSIS — R05 Cough: Secondary | ICD-10-CM | POA: Diagnosis not present

## 2018-11-23 DIAGNOSIS — E039 Hypothyroidism, unspecified: Secondary | ICD-10-CM | POA: Diagnosis not present

## 2018-11-23 DIAGNOSIS — Z951 Presence of aortocoronary bypass graft: Secondary | ICD-10-CM | POA: Diagnosis not present

## 2018-11-23 DIAGNOSIS — K219 Gastro-esophageal reflux disease without esophagitis: Secondary | ICD-10-CM | POA: Diagnosis present

## 2018-11-23 DIAGNOSIS — Z79899 Other long term (current) drug therapy: Secondary | ICD-10-CM | POA: Diagnosis not present

## 2018-11-23 DIAGNOSIS — G8929 Other chronic pain: Secondary | ICD-10-CM | POA: Diagnosis present

## 2018-11-23 DIAGNOSIS — M546 Pain in thoracic spine: Secondary | ICD-10-CM | POA: Diagnosis present

## 2018-11-23 DIAGNOSIS — R0789 Other chest pain: Secondary | ICD-10-CM | POA: Diagnosis not present

## 2018-11-23 DIAGNOSIS — R410 Disorientation, unspecified: Secondary | ICD-10-CM | POA: Diagnosis not present

## 2018-11-23 DIAGNOSIS — J441 Chronic obstructive pulmonary disease with (acute) exacerbation: Secondary | ICD-10-CM | POA: Diagnosis not present

## 2018-11-23 DIAGNOSIS — R636 Underweight: Secondary | ICD-10-CM | POA: Diagnosis not present

## 2018-11-23 DIAGNOSIS — J069 Acute upper respiratory infection, unspecified: Secondary | ICD-10-CM | POA: Diagnosis not present

## 2018-11-23 DIAGNOSIS — M62838 Other muscle spasm: Secondary | ICD-10-CM | POA: Diagnosis present

## 2018-11-27 DIAGNOSIS — M810 Age-related osteoporosis without current pathological fracture: Secondary | ICD-10-CM | POA: Diagnosis not present

## 2018-11-27 DIAGNOSIS — Z9981 Dependence on supplemental oxygen: Secondary | ICD-10-CM | POA: Diagnosis not present

## 2018-11-27 DIAGNOSIS — Z7722 Contact with and (suspected) exposure to environmental tobacco smoke (acute) (chronic): Secondary | ICD-10-CM | POA: Diagnosis not present

## 2018-11-27 DIAGNOSIS — Z951 Presence of aortocoronary bypass graft: Secondary | ICD-10-CM | POA: Diagnosis not present

## 2018-11-27 DIAGNOSIS — Z9181 History of falling: Secondary | ICD-10-CM | POA: Diagnosis not present

## 2018-11-27 DIAGNOSIS — G8929 Other chronic pain: Secondary | ICD-10-CM | POA: Diagnosis not present

## 2018-11-27 DIAGNOSIS — N390 Urinary tract infection, site not specified: Secondary | ICD-10-CM | POA: Diagnosis not present

## 2018-11-27 DIAGNOSIS — I509 Heart failure, unspecified: Secondary | ICD-10-CM | POA: Diagnosis not present

## 2018-11-27 DIAGNOSIS — Z79899 Other long term (current) drug therapy: Secondary | ICD-10-CM | POA: Diagnosis not present

## 2018-11-27 DIAGNOSIS — M546 Pain in thoracic spine: Secondary | ICD-10-CM | POA: Diagnosis not present

## 2018-11-27 DIAGNOSIS — J441 Chronic obstructive pulmonary disease with (acute) exacerbation: Secondary | ICD-10-CM | POA: Diagnosis not present

## 2018-11-30 DIAGNOSIS — M810 Age-related osteoporosis without current pathological fracture: Secondary | ICD-10-CM | POA: Diagnosis not present

## 2018-11-30 DIAGNOSIS — N390 Urinary tract infection, site not specified: Secondary | ICD-10-CM | POA: Diagnosis not present

## 2018-11-30 DIAGNOSIS — M546 Pain in thoracic spine: Secondary | ICD-10-CM | POA: Diagnosis not present

## 2018-11-30 DIAGNOSIS — G8929 Other chronic pain: Secondary | ICD-10-CM | POA: Diagnosis not present

## 2018-11-30 DIAGNOSIS — J441 Chronic obstructive pulmonary disease with (acute) exacerbation: Secondary | ICD-10-CM | POA: Diagnosis not present

## 2018-11-30 DIAGNOSIS — I509 Heart failure, unspecified: Secondary | ICD-10-CM | POA: Diagnosis not present

## 2018-12-01 DIAGNOSIS — J441 Chronic obstructive pulmonary disease with (acute) exacerbation: Secondary | ICD-10-CM | POA: Diagnosis not present

## 2018-12-01 DIAGNOSIS — G8929 Other chronic pain: Secondary | ICD-10-CM | POA: Diagnosis not present

## 2018-12-01 DIAGNOSIS — I509 Heart failure, unspecified: Secondary | ICD-10-CM | POA: Diagnosis not present

## 2018-12-01 DIAGNOSIS — M546 Pain in thoracic spine: Secondary | ICD-10-CM | POA: Diagnosis not present

## 2018-12-01 DIAGNOSIS — N390 Urinary tract infection, site not specified: Secondary | ICD-10-CM | POA: Diagnosis not present

## 2018-12-01 DIAGNOSIS — M810 Age-related osteoporosis without current pathological fracture: Secondary | ICD-10-CM | POA: Diagnosis not present

## 2018-12-02 DIAGNOSIS — M546 Pain in thoracic spine: Secondary | ICD-10-CM | POA: Diagnosis not present

## 2018-12-02 DIAGNOSIS — I050 Rheumatic mitral stenosis: Secondary | ICD-10-CM | POA: Insufficient documentation

## 2018-12-02 DIAGNOSIS — G8929 Other chronic pain: Secondary | ICD-10-CM | POA: Diagnosis not present

## 2018-12-02 DIAGNOSIS — M810 Age-related osteoporosis without current pathological fracture: Secondary | ICD-10-CM | POA: Diagnosis not present

## 2018-12-02 DIAGNOSIS — J441 Chronic obstructive pulmonary disease with (acute) exacerbation: Secondary | ICD-10-CM | POA: Diagnosis not present

## 2018-12-02 DIAGNOSIS — I4819 Other persistent atrial fibrillation: Secondary | ICD-10-CM | POA: Insufficient documentation

## 2018-12-02 DIAGNOSIS — N390 Urinary tract infection, site not specified: Secondary | ICD-10-CM | POA: Diagnosis not present

## 2018-12-02 DIAGNOSIS — I509 Heart failure, unspecified: Secondary | ICD-10-CM | POA: Diagnosis not present

## 2018-12-02 NOTE — Progress Notes (Signed)
Cardiology Office Note    Date:  12/07/2018   ID:  Carmen, Vaughn Aug 25, 1920, MRN [id number]  PCP:  Carmen Percy, MD  Cardiologist: Carmen Lesches, MD EPS: None  Chief Complaint  Patient presents with  . Hospitalization Follow-up    History of Present Illness:  Carmen Vaughn is a 85 y.o. female with history of CAD status post previous CABG, mild aortic stenosis and moderate MR, hypertension, hyperlipidemia, echo 05/2017 at [facility] normal LVEF but restrictive diastolic filling pattern moderate to severe mitral stenosis felt causing her shortness of breath.  Last saw Dr. Domenic Vaughn 01/2018 at which time she had new atrial fibrillation.  She was not felt to be a good candidate for anticoagulation and would have to take Coumadin instead of DOAC in light of significant mitral valve disease.  Lasix was used as needed for leg swelling.  Patient underwent sternal wire removal for exposed wire 03/13/2018.  Patient comes in accompanied by her son who she lives with.  She was just hospitalized at [facility] for upper respiratory infection.  When she got home she was up 7 pounds however her son gave her Lasix for 4 days and her weight came down to baseline.  She is now on home oxygen and quite fragile.  Past Medical History:  Diagnosis Date  . Aortic stenosis    Mild, 06/2012  . Arthritis   . COPD (chronic obstructive pulmonary disease) ([place])   . Coronary atherosclerosis of native coronary artery    Multivessel status post CABG  . Dyspnea    on exertion  . Essential hypertension, benign   . GERD (gastroesophageal reflux disease)   . History of kidney stones   . Hypothyroidism   . Mitral regurgitation    Moderate, 06/2012  . Mixed hyperlipidemia    Statin intolerant    Past Surgical History:  Procedure Laterality Date  . ABDOMINAL HYSTERECTOMY    . CORONARY ARTERY BYPASS GRAFT  2002   LIMA to LAD, SVG to diagonal  . KYPHOPLASTY     two times  . STERNAL WIRES REMOVAL  N/A 03/13/2018   Procedure: STERNAL WIRES REMOVAL;  Surgeon: Carmen Vaughn, Carmen Salina, MD;  Location: [facility] OR;  Service: Thoracic;  Laterality: N/A;    Current Medications: Current Meds  Medication Sig  . albuterol (PROVENTIL) (2.5 MG/3ML) 0.083% nebulizer solution Take 2.5 mg by nebulization 4 (four) times daily.   . Cholecalciferol (VITAMIN D3) 1000 units CAPS Take 1 capsule by mouth daily.  . diclofenac sodium (VOLTAREN) 1 % GEL Apply 1 application topically 4 (four) times daily as needed (PAIN).   . feeding supplement (BOOST / RESOURCE BREEZE) LIQD Take 1 Container by mouth 2 (two) times daily as needed.   . furosemide (LASIX) 20 MG tablet Take 1 tablet (20 mg total) by mouth as needed (For 2lb weight gain in 24 hours). (Patient taking differently: Take 40 mg by mouth as needed (For 2lb weight gain in 24 hours). )  . guaiFENesin (MUCINEX) 600 MG 12 hr tablet Take 600 mg by mouth 2 (two) times daily.  Marland Kitchen HYDROcodone-acetaminophen (NORCO) 10-325 MG tablet Take 1 tablet by mouth 3 (three) times daily as needed for severe pain.   Marland Kitchen levothyroxine (SYNTHROID, LEVOTHROID) 100 MCG tablet Take 100 mcg by mouth daily before breakfast.   . PROAIR HFA 108 (90 BASE) MCG/ACT inhaler Inhale 2 puffs into the lungs 3 (three) times daily.   . raloxifene (EVISTA) 60 MG tablet Take 60 mg by  mouth daily.       Allergies:   Nitroglycerin; Penicillins; Statins; and Aleve [naproxen]   Social History   Socioeconomic History  . Marital status: Widowed    Spouse name: Not on file  . Number of children: Not on file  . Years of education: Not on file  . Highest education level: Not on file  Occupational History  . Not on file  Social Needs  . Financial resource strain: Not on file  . Food insecurity:    Worry: Not on file    Inability: Not on file  . Transportation needs:    Medical: Not on file    Non-medical: Not on file  Tobacco Use  . Smoking status: Passive Smoke Exposure - Never Smoker  . Smokeless  tobacco: Never Used  . Tobacco comment: Husband smoked  Substance and Sexual Activity  . Alcohol use: No    Alcohol/week: 0.0 standard drinks  . Drug use: No  . Sexual activity: Not on file  Lifestyle  . Physical activity:    Days per week: Not on file    Minutes per session: Not on file  . Stress: Not on file  Relationships  . Social connections:    Talks on phone: Not on file    Gets together: Not on file    Attends religious service: Not on file    Active member of club or organization: Not on file    Attends meetings of clubs or organizations: Not on file    Relationship status: Not on file  Other Topics Concern  . Not on file  Social History Narrative  . Not on file     Family History:  The patient's family history is not on file.   ROS:   Please see the history of present illness.    Review of Systems  Constitution: Positive for malaise/fatigue.  Cardiovascular: Positive for dyspnea on exertion and leg swelling.  Respiratory: Positive for shortness of breath.   Musculoskeletal: Positive for arthritis.  Genitourinary: Positive for frequency and urgency.   All other systems reviewed and are negative.   PHYSICAL EXAM:   VS:  BP 120/64 (BP Location: Right Arm)   Pulse (!) 51   Ht 5\' 6"  (1.676 m)   Wt 120 lb (54.4 kg)   SpO2 90%   BMI 19.37 kg/m   Physical Exam  GEN: Elderly, thin, in no acute distress, on oxygen Neck: Increased JVD, no carotid bruits, or masses Cardiac: Irregular irregular with 2/6 diastolic murmur at the apex Respiratory: Decreased breath sounds throughout with crackles at the bases GI: soft, nontender, nondistended, + BS Ext: without cyanosis, clubbing, or edema, Good distal pulses bilaterally Neuro:  Alert and Oriented x 3 Psych: euthymic mood, full affect  Wt Readings from Last 3 Encounters:  12/07/18 120 lb (54.4 kg)  03/13/18 117 lb (53.1 kg)  03/12/18 117 lb (53.1 kg)      Studies/Labs Reviewed:   EKG:  EKG is not ordered  today.  Recent Labs: 03/12/2018: ALT 18; BUN 23; Creatinine, Ser 0.93; Hemoglobin 11.6; Platelets 132; Potassium 4.3; Sodium 139   Lipid Panel No results found for: CHOL, TRIG, HDL, CHOLHDL, VLDL, LDLCALC, LDLDIRECT  Additional studies/ records that were reviewed today include:  2D echo 05/2017 see above dictation    ASSESSMENT:    1. Atherosclerosis of native coronary artery of native heart without angina pectoris   2. Mitral valve stenosis, unspecified etiology   3. Dyspnea, unspecified type  4. Chronic obstructive pulmonary disease, unspecified COPD type ([place])   5. Persistent atrial fibrillation   6. Hyperlipidemia, unspecified hyperlipidemia type      PLAN:  In order of problems listed above:  CAD status post previous CABG sternal wire removed last year.  Stable without angina.  Severe mitral stenosis on echo 05/2018 with supposed to have follow-up echo but not done here.  Obtaining records from recent hospitalization to see if they did 1 at [facility].  Follow-up with Dr. Domenic Vaughn next available in [place]   chronic shortness of breath/COPD with recent hospitalization for URI treated with antibiotics and steroids and now on home O2  Persistent atrial fibrillation not felt to be candidate for DOAC, rate controlled.      Medication Adjustments/Labs and Tests Ordered: Current medicines are reviewed at length with the patient today.  Concerns regarding medicines are outlined above.  Medication changes, Labs and Tests ordered today are listed in the Patient Instructions below. Patient Instructions  Medication Instructions:  Your physician recommends that you continue on your current medications as directed. Please refer to the Current Medication list given to you today.  If you need a refill on your cardiac medications before your next appointment, please call your pharmacy.   Lab work: NONE   If you have labs (blood work) drawn today and your tests are completely  normal, you will receive your results only by: Marland Kitchen MyChart Message (if you have MyChart) OR . A paper copy in the mail If you have any lab test that is abnormal or we need to change your treatment, we will call you to review the results.  Testing/Procedures: NONE   Follow-Up: At [facility], you and your health needs are our priority.  As part of our continuing mission to provide you with exceptional heart care, we have created designated Provider Care Teams.  These Care Teams include your primary Cardiologist (physician) and Advanced Practice Providers (APPs -  Physician Assistants and Nurse Practitioners) who all work together to provide you with the care you need, when you need it. You will need a follow up appointment .  Please call our office 2 months in advance to schedule this appointment.  You may see Carmen Lesches, MD or one of the following Advanced Practice Providers on your designated Care Team:   Bernerd Pho, PA-C [facility]) . Ermalinda Barrios, PA-C ([place])  Any Other Special Instructions Will Be Listed Below (If Applicable). Thank you for choosing [facility]!        Carmen Boast, PA-C  12/07/2018 12:24 PM    [facility] [place], [place], [place]  [address] Phone: [phone]; Fax: [phone]

## 2018-12-03 DIAGNOSIS — I509 Heart failure, unspecified: Secondary | ICD-10-CM | POA: Diagnosis not present

## 2018-12-03 DIAGNOSIS — N390 Urinary tract infection, site not specified: Secondary | ICD-10-CM | POA: Diagnosis not present

## 2018-12-03 DIAGNOSIS — G8929 Other chronic pain: Secondary | ICD-10-CM | POA: Diagnosis not present

## 2018-12-03 DIAGNOSIS — M810 Age-related osteoporosis without current pathological fracture: Secondary | ICD-10-CM | POA: Diagnosis not present

## 2018-12-03 DIAGNOSIS — M546 Pain in thoracic spine: Secondary | ICD-10-CM | POA: Diagnosis not present

## 2018-12-03 DIAGNOSIS — J441 Chronic obstructive pulmonary disease with (acute) exacerbation: Secondary | ICD-10-CM | POA: Diagnosis not present

## 2018-12-04 DIAGNOSIS — J441 Chronic obstructive pulmonary disease with (acute) exacerbation: Secondary | ICD-10-CM | POA: Diagnosis not present

## 2018-12-04 DIAGNOSIS — I509 Heart failure, unspecified: Secondary | ICD-10-CM | POA: Diagnosis not present

## 2018-12-04 DIAGNOSIS — N390 Urinary tract infection, site not specified: Secondary | ICD-10-CM | POA: Diagnosis not present

## 2018-12-04 DIAGNOSIS — G8929 Other chronic pain: Secondary | ICD-10-CM | POA: Diagnosis not present

## 2018-12-04 DIAGNOSIS — M810 Age-related osteoporosis without current pathological fracture: Secondary | ICD-10-CM | POA: Diagnosis not present

## 2018-12-04 DIAGNOSIS — M546 Pain in thoracic spine: Secondary | ICD-10-CM | POA: Diagnosis not present

## 2018-12-07 ENCOUNTER — Encounter: Payer: Self-pay | Admitting: *Deleted

## 2018-12-07 ENCOUNTER — Encounter: Payer: Self-pay | Admitting: Physician Assistant

## 2018-12-07 ENCOUNTER — Ambulatory Visit (INDEPENDENT_AMBULATORY_CARE_PROVIDER_SITE_OTHER): Payer: Medicare Other | Admitting: Physician Assistant

## 2018-12-07 VITALS — BP 120/64 | HR 51 | Ht 66.0 in | Wt 120.0 lb

## 2018-12-07 DIAGNOSIS — J449 Chronic obstructive pulmonary disease, unspecified: Secondary | ICD-10-CM | POA: Diagnosis not present

## 2018-12-07 DIAGNOSIS — E785 Hyperlipidemia, unspecified: Secondary | ICD-10-CM | POA: Diagnosis not present

## 2018-12-07 DIAGNOSIS — I4819 Other persistent atrial fibrillation: Secondary | ICD-10-CM | POA: Diagnosis not present

## 2018-12-07 DIAGNOSIS — R0600 Dyspnea, unspecified: Secondary | ICD-10-CM | POA: Insufficient documentation

## 2018-12-07 DIAGNOSIS — I509 Heart failure, unspecified: Secondary | ICD-10-CM | POA: Diagnosis not present

## 2018-12-07 DIAGNOSIS — M810 Age-related osteoporosis without current pathological fracture: Secondary | ICD-10-CM | POA: Diagnosis not present

## 2018-12-07 DIAGNOSIS — G8929 Other chronic pain: Secondary | ICD-10-CM | POA: Diagnosis not present

## 2018-12-07 DIAGNOSIS — I050 Rheumatic mitral stenosis: Secondary | ICD-10-CM

## 2018-12-07 DIAGNOSIS — M546 Pain in thoracic spine: Secondary | ICD-10-CM | POA: Diagnosis not present

## 2018-12-07 DIAGNOSIS — I2510 Atherosclerotic heart disease of native coronary artery without angina pectoris: Secondary | ICD-10-CM | POA: Diagnosis not present

## 2018-12-07 DIAGNOSIS — N390 Urinary tract infection, site not specified: Secondary | ICD-10-CM | POA: Diagnosis not present

## 2018-12-07 DIAGNOSIS — J441 Chronic obstructive pulmonary disease with (acute) exacerbation: Secondary | ICD-10-CM | POA: Diagnosis not present

## 2018-12-07 NOTE — Patient Instructions (Signed)
Medication Instructions:  Your physician recommends that you continue on your current medications as directed. Please refer to the Current Medication list given to you today.  If you need a refill on your cardiac medications before your next appointment, please call your pharmacy.   Lab work: NONE   If you have labs (blood work) drawn today and your tests are completely normal, you will receive your results only by: Marland Kitchen MyChart Message (if you have MyChart) OR . A paper copy in the mail If you have any lab test that is abnormal or we need to change your treatment, we will call you to review the results.  Testing/Procedures: NONE   Follow-Up: At [facility], you and your health needs are our priority.  As part of our continuing mission to provide you with exceptional heart care, we have created designated Provider Care Teams.  These Care Teams include your primary Cardiologist (physician) and Advanced Practice Providers (APPs -  Physician Assistants and Nurse Practitioners) who all work together to provide you with the care you need, when you need it. You will need a follow up appointment .  Please call our office 2 months in advance to schedule this appointment.  You may see Rozann Lesches, MD or one of the following Advanced Practice Providers on your designated Care Team:   Bernerd Pho, PA-C [facility]) . Ermalinda Barrios, PA-C ([place])  Any Other Special Instructions Will Be Listed Below (If Applicable). Thank you for choosing [facility]!

## 2018-12-08 DIAGNOSIS — N390 Urinary tract infection, site not specified: Secondary | ICD-10-CM | POA: Diagnosis not present

## 2018-12-08 DIAGNOSIS — I509 Heart failure, unspecified: Secondary | ICD-10-CM | POA: Diagnosis not present

## 2018-12-08 DIAGNOSIS — G8929 Other chronic pain: Secondary | ICD-10-CM | POA: Diagnosis not present

## 2018-12-08 DIAGNOSIS — M810 Age-related osteoporosis without current pathological fracture: Secondary | ICD-10-CM | POA: Diagnosis not present

## 2018-12-08 DIAGNOSIS — J441 Chronic obstructive pulmonary disease with (acute) exacerbation: Secondary | ICD-10-CM | POA: Diagnosis not present

## 2018-12-08 DIAGNOSIS — M546 Pain in thoracic spine: Secondary | ICD-10-CM | POA: Diagnosis not present

## 2018-12-10 DIAGNOSIS — G8929 Other chronic pain: Secondary | ICD-10-CM | POA: Diagnosis not present

## 2018-12-10 DIAGNOSIS — M810 Age-related osteoporosis without current pathological fracture: Secondary | ICD-10-CM | POA: Diagnosis not present

## 2018-12-10 DIAGNOSIS — M546 Pain in thoracic spine: Secondary | ICD-10-CM | POA: Diagnosis not present

## 2018-12-10 DIAGNOSIS — J441 Chronic obstructive pulmonary disease with (acute) exacerbation: Secondary | ICD-10-CM | POA: Diagnosis not present

## 2018-12-10 DIAGNOSIS — I509 Heart failure, unspecified: Secondary | ICD-10-CM | POA: Diagnosis not present

## 2018-12-10 DIAGNOSIS — N390 Urinary tract infection, site not specified: Secondary | ICD-10-CM | POA: Diagnosis not present

## 2018-12-14 DIAGNOSIS — N390 Urinary tract infection, site not specified: Secondary | ICD-10-CM | POA: Diagnosis not present

## 2018-12-14 DIAGNOSIS — M546 Pain in thoracic spine: Secondary | ICD-10-CM | POA: Diagnosis not present

## 2018-12-14 DIAGNOSIS — J441 Chronic obstructive pulmonary disease with (acute) exacerbation: Secondary | ICD-10-CM | POA: Diagnosis not present

## 2018-12-14 DIAGNOSIS — M810 Age-related osteoporosis without current pathological fracture: Secondary | ICD-10-CM | POA: Diagnosis not present

## 2018-12-14 DIAGNOSIS — G8929 Other chronic pain: Secondary | ICD-10-CM | POA: Diagnosis not present

## 2018-12-14 DIAGNOSIS — I509 Heart failure, unspecified: Secondary | ICD-10-CM | POA: Diagnosis not present

## 2018-12-15 DIAGNOSIS — G8929 Other chronic pain: Secondary | ICD-10-CM | POA: Diagnosis not present

## 2018-12-15 DIAGNOSIS — M810 Age-related osteoporosis without current pathological fracture: Secondary | ICD-10-CM | POA: Diagnosis not present

## 2018-12-15 DIAGNOSIS — N390 Urinary tract infection, site not specified: Secondary | ICD-10-CM | POA: Diagnosis not present

## 2018-12-15 DIAGNOSIS — J441 Chronic obstructive pulmonary disease with (acute) exacerbation: Secondary | ICD-10-CM | POA: Diagnosis not present

## 2018-12-15 DIAGNOSIS — M546 Pain in thoracic spine: Secondary | ICD-10-CM | POA: Diagnosis not present

## 2018-12-15 DIAGNOSIS — I509 Heart failure, unspecified: Secondary | ICD-10-CM | POA: Diagnosis not present

## 2018-12-16 DIAGNOSIS — N390 Urinary tract infection, site not specified: Secondary | ICD-10-CM | POA: Diagnosis not present

## 2018-12-16 DIAGNOSIS — J441 Chronic obstructive pulmonary disease with (acute) exacerbation: Secondary | ICD-10-CM | POA: Diagnosis not present

## 2018-12-16 DIAGNOSIS — M546 Pain in thoracic spine: Secondary | ICD-10-CM | POA: Diagnosis not present

## 2018-12-16 DIAGNOSIS — G8929 Other chronic pain: Secondary | ICD-10-CM | POA: Diagnosis not present

## 2018-12-16 DIAGNOSIS — I509 Heart failure, unspecified: Secondary | ICD-10-CM | POA: Diagnosis not present

## 2018-12-16 DIAGNOSIS — M810 Age-related osteoporosis without current pathological fracture: Secondary | ICD-10-CM | POA: Diagnosis not present

## 2018-12-17 DIAGNOSIS — M810 Age-related osteoporosis without current pathological fracture: Secondary | ICD-10-CM | POA: Diagnosis not present

## 2018-12-17 DIAGNOSIS — N390 Urinary tract infection, site not specified: Secondary | ICD-10-CM | POA: Diagnosis not present

## 2018-12-17 DIAGNOSIS — J441 Chronic obstructive pulmonary disease with (acute) exacerbation: Secondary | ICD-10-CM | POA: Diagnosis not present

## 2018-12-17 DIAGNOSIS — Z9981 Dependence on supplemental oxygen: Secondary | ICD-10-CM | POA: Diagnosis not present

## 2018-12-17 DIAGNOSIS — G8929 Other chronic pain: Secondary | ICD-10-CM | POA: Diagnosis not present

## 2018-12-17 DIAGNOSIS — M546 Pain in thoracic spine: Secondary | ICD-10-CM | POA: Diagnosis not present

## 2018-12-17 DIAGNOSIS — Z9181 History of falling: Secondary | ICD-10-CM | POA: Diagnosis not present

## 2018-12-17 DIAGNOSIS — I509 Heart failure, unspecified: Secondary | ICD-10-CM | POA: Diagnosis not present

## 2018-12-18 DIAGNOSIS — I509 Heart failure, unspecified: Secondary | ICD-10-CM | POA: Diagnosis not present

## 2018-12-18 DIAGNOSIS — N390 Urinary tract infection, site not specified: Secondary | ICD-10-CM | POA: Diagnosis not present

## 2018-12-18 DIAGNOSIS — G8929 Other chronic pain: Secondary | ICD-10-CM | POA: Diagnosis not present

## 2018-12-18 DIAGNOSIS — M546 Pain in thoracic spine: Secondary | ICD-10-CM | POA: Diagnosis not present

## 2018-12-18 DIAGNOSIS — J441 Chronic obstructive pulmonary disease with (acute) exacerbation: Secondary | ICD-10-CM | POA: Diagnosis not present

## 2018-12-18 DIAGNOSIS — M810 Age-related osteoporosis without current pathological fracture: Secondary | ICD-10-CM | POA: Diagnosis not present

## 2018-12-22 DIAGNOSIS — N390 Urinary tract infection, site not specified: Secondary | ICD-10-CM | POA: Diagnosis not present

## 2018-12-22 DIAGNOSIS — J441 Chronic obstructive pulmonary disease with (acute) exacerbation: Secondary | ICD-10-CM | POA: Diagnosis not present

## 2018-12-22 DIAGNOSIS — I509 Heart failure, unspecified: Secondary | ICD-10-CM | POA: Diagnosis not present

## 2018-12-22 DIAGNOSIS — M546 Pain in thoracic spine: Secondary | ICD-10-CM | POA: Diagnosis not present

## 2018-12-22 DIAGNOSIS — G8929 Other chronic pain: Secondary | ICD-10-CM | POA: Diagnosis not present

## 2018-12-22 DIAGNOSIS — M810 Age-related osteoporosis without current pathological fracture: Secondary | ICD-10-CM | POA: Diagnosis not present

## 2018-12-23 DIAGNOSIS — N390 Urinary tract infection, site not specified: Secondary | ICD-10-CM | POA: Diagnosis not present

## 2018-12-23 DIAGNOSIS — M810 Age-related osteoporosis without current pathological fracture: Secondary | ICD-10-CM | POA: Diagnosis not present

## 2018-12-23 DIAGNOSIS — I509 Heart failure, unspecified: Secondary | ICD-10-CM | POA: Diagnosis not present

## 2018-12-23 DIAGNOSIS — J441 Chronic obstructive pulmonary disease with (acute) exacerbation: Secondary | ICD-10-CM | POA: Diagnosis not present

## 2018-12-23 DIAGNOSIS — M546 Pain in thoracic spine: Secondary | ICD-10-CM | POA: Diagnosis not present

## 2018-12-23 DIAGNOSIS — G8929 Other chronic pain: Secondary | ICD-10-CM | POA: Diagnosis not present

## 2018-12-24 DIAGNOSIS — J441 Chronic obstructive pulmonary disease with (acute) exacerbation: Secondary | ICD-10-CM | POA: Diagnosis not present

## 2018-12-24 DIAGNOSIS — G8929 Other chronic pain: Secondary | ICD-10-CM | POA: Diagnosis not present

## 2018-12-24 DIAGNOSIS — M810 Age-related osteoporosis without current pathological fracture: Secondary | ICD-10-CM | POA: Diagnosis not present

## 2018-12-24 DIAGNOSIS — M546 Pain in thoracic spine: Secondary | ICD-10-CM | POA: Diagnosis not present

## 2018-12-24 DIAGNOSIS — N390 Urinary tract infection, site not specified: Secondary | ICD-10-CM | POA: Diagnosis not present

## 2018-12-24 DIAGNOSIS — I509 Heart failure, unspecified: Secondary | ICD-10-CM | POA: Diagnosis not present

## 2018-12-26 DIAGNOSIS — R0602 Shortness of breath: Secondary | ICD-10-CM | POA: Diagnosis not present

## 2018-12-26 DIAGNOSIS — M4850XA Collapsed vertebra, not elsewhere classified, site unspecified, initial encounter for fracture: Secondary | ICD-10-CM | POA: Diagnosis not present

## 2018-12-26 DIAGNOSIS — M2550 Pain in unspecified joint: Secondary | ICD-10-CM | POA: Diagnosis not present

## 2018-12-26 DIAGNOSIS — M810 Age-related osteoporosis without current pathological fracture: Secondary | ICD-10-CM | POA: Diagnosis not present

## 2018-12-26 DIAGNOSIS — J441 Chronic obstructive pulmonary disease with (acute) exacerbation: Secondary | ICD-10-CM | POA: Diagnosis not present

## 2018-12-26 DIAGNOSIS — G629 Polyneuropathy, unspecified: Secondary | ICD-10-CM | POA: Diagnosis not present

## 2018-12-26 DIAGNOSIS — R634 Abnormal weight loss: Secondary | ICD-10-CM | POA: Diagnosis not present

## 2018-12-27 DIAGNOSIS — M810 Age-related osteoporosis without current pathological fracture: Secondary | ICD-10-CM | POA: Diagnosis not present

## 2018-12-27 DIAGNOSIS — N390 Urinary tract infection, site not specified: Secondary | ICD-10-CM | POA: Diagnosis not present

## 2018-12-27 DIAGNOSIS — Z9181 History of falling: Secondary | ICD-10-CM | POA: Diagnosis not present

## 2018-12-27 DIAGNOSIS — Z7722 Contact with and (suspected) exposure to environmental tobacco smoke (acute) (chronic): Secondary | ICD-10-CM | POA: Diagnosis not present

## 2018-12-27 DIAGNOSIS — J441 Chronic obstructive pulmonary disease with (acute) exacerbation: Secondary | ICD-10-CM | POA: Diagnosis not present

## 2018-12-27 DIAGNOSIS — Z9981 Dependence on supplemental oxygen: Secondary | ICD-10-CM | POA: Diagnosis not present

## 2018-12-27 DIAGNOSIS — G8929 Other chronic pain: Secondary | ICD-10-CM | POA: Diagnosis not present

## 2018-12-27 DIAGNOSIS — Z79899 Other long term (current) drug therapy: Secondary | ICD-10-CM | POA: Diagnosis not present

## 2018-12-27 DIAGNOSIS — M546 Pain in thoracic spine: Secondary | ICD-10-CM | POA: Diagnosis not present

## 2018-12-27 DIAGNOSIS — Z951 Presence of aortocoronary bypass graft: Secondary | ICD-10-CM | POA: Diagnosis not present

## 2018-12-27 DIAGNOSIS — I509 Heart failure, unspecified: Secondary | ICD-10-CM | POA: Diagnosis not present

## 2018-12-29 DIAGNOSIS — M546 Pain in thoracic spine: Secondary | ICD-10-CM | POA: Diagnosis not present

## 2018-12-29 DIAGNOSIS — M810 Age-related osteoporosis without current pathological fracture: Secondary | ICD-10-CM | POA: Diagnosis not present

## 2018-12-29 DIAGNOSIS — J441 Chronic obstructive pulmonary disease with (acute) exacerbation: Secondary | ICD-10-CM | POA: Diagnosis not present

## 2018-12-29 DIAGNOSIS — N390 Urinary tract infection, site not specified: Secondary | ICD-10-CM | POA: Diagnosis not present

## 2018-12-29 DIAGNOSIS — I509 Heart failure, unspecified: Secondary | ICD-10-CM | POA: Diagnosis not present

## 2018-12-29 DIAGNOSIS — G8929 Other chronic pain: Secondary | ICD-10-CM | POA: Diagnosis not present

## 2018-12-30 DIAGNOSIS — G8929 Other chronic pain: Secondary | ICD-10-CM | POA: Diagnosis not present

## 2018-12-30 DIAGNOSIS — M129 Arthropathy, unspecified: Secondary | ICD-10-CM | POA: Diagnosis not present

## 2018-12-30 DIAGNOSIS — E039 Hypothyroidism, unspecified: Secondary | ICD-10-CM | POA: Diagnosis not present

## 2018-12-30 DIAGNOSIS — J441 Chronic obstructive pulmonary disease with (acute) exacerbation: Secondary | ICD-10-CM | POA: Diagnosis not present

## 2018-12-30 DIAGNOSIS — I509 Heart failure, unspecified: Secondary | ICD-10-CM | POA: Diagnosis not present

## 2018-12-30 DIAGNOSIS — M4850XA Collapsed vertebra, not elsewhere classified, site unspecified, initial encounter for fracture: Secondary | ICD-10-CM | POA: Diagnosis not present

## 2018-12-30 DIAGNOSIS — M810 Age-related osteoporosis without current pathological fracture: Secondary | ICD-10-CM | POA: Diagnosis not present

## 2018-12-30 DIAGNOSIS — M546 Pain in thoracic spine: Secondary | ICD-10-CM | POA: Diagnosis not present

## 2018-12-30 DIAGNOSIS — N390 Urinary tract infection, site not specified: Secondary | ICD-10-CM | POA: Diagnosis not present

## 2018-12-30 DIAGNOSIS — Z6822 Body mass index (BMI) 22.0-22.9, adult: Secondary | ICD-10-CM | POA: Diagnosis not present

## 2018-12-30 DIAGNOSIS — J449 Chronic obstructive pulmonary disease, unspecified: Secondary | ICD-10-CM | POA: Diagnosis not present

## 2018-12-31 DIAGNOSIS — G8929 Other chronic pain: Secondary | ICD-10-CM | POA: Diagnosis not present

## 2018-12-31 DIAGNOSIS — M810 Age-related osteoporosis without current pathological fracture: Secondary | ICD-10-CM | POA: Diagnosis not present

## 2018-12-31 DIAGNOSIS — N390 Urinary tract infection, site not specified: Secondary | ICD-10-CM | POA: Diagnosis not present

## 2018-12-31 DIAGNOSIS — I509 Heart failure, unspecified: Secondary | ICD-10-CM | POA: Diagnosis not present

## 2018-12-31 DIAGNOSIS — M546 Pain in thoracic spine: Secondary | ICD-10-CM | POA: Diagnosis not present

## 2018-12-31 DIAGNOSIS — J441 Chronic obstructive pulmonary disease with (acute) exacerbation: Secondary | ICD-10-CM | POA: Diagnosis not present

## 2019-01-04 DIAGNOSIS — M810 Age-related osteoporosis without current pathological fracture: Secondary | ICD-10-CM | POA: Diagnosis not present

## 2019-01-04 DIAGNOSIS — I509 Heart failure, unspecified: Secondary | ICD-10-CM | POA: Diagnosis not present

## 2019-01-04 DIAGNOSIS — N390 Urinary tract infection, site not specified: Secondary | ICD-10-CM | POA: Diagnosis not present

## 2019-01-04 DIAGNOSIS — M546 Pain in thoracic spine: Secondary | ICD-10-CM | POA: Diagnosis not present

## 2019-01-04 DIAGNOSIS — G8929 Other chronic pain: Secondary | ICD-10-CM | POA: Diagnosis not present

## 2019-01-04 DIAGNOSIS — J441 Chronic obstructive pulmonary disease with (acute) exacerbation: Secondary | ICD-10-CM | POA: Diagnosis not present

## 2019-01-05 DIAGNOSIS — I509 Heart failure, unspecified: Secondary | ICD-10-CM | POA: Diagnosis not present

## 2019-01-05 DIAGNOSIS — N390 Urinary tract infection, site not specified: Secondary | ICD-10-CM | POA: Diagnosis not present

## 2019-01-05 DIAGNOSIS — M810 Age-related osteoporosis without current pathological fracture: Secondary | ICD-10-CM | POA: Diagnosis not present

## 2019-01-05 DIAGNOSIS — M546 Pain in thoracic spine: Secondary | ICD-10-CM | POA: Diagnosis not present

## 2019-01-05 DIAGNOSIS — G8929 Other chronic pain: Secondary | ICD-10-CM | POA: Diagnosis not present

## 2019-01-05 DIAGNOSIS — J441 Chronic obstructive pulmonary disease with (acute) exacerbation: Secondary | ICD-10-CM | POA: Diagnosis not present

## 2019-01-06 ENCOUNTER — Other Ambulatory Visit: Payer: Self-pay | Admitting: Cardiology

## 2019-01-06 DIAGNOSIS — J441 Chronic obstructive pulmonary disease with (acute) exacerbation: Secondary | ICD-10-CM | POA: Diagnosis not present

## 2019-01-06 DIAGNOSIS — M546 Pain in thoracic spine: Secondary | ICD-10-CM | POA: Diagnosis not present

## 2019-01-06 DIAGNOSIS — I509 Heart failure, unspecified: Secondary | ICD-10-CM | POA: Diagnosis not present

## 2019-01-06 DIAGNOSIS — G8929 Other chronic pain: Secondary | ICD-10-CM | POA: Diagnosis not present

## 2019-01-06 DIAGNOSIS — N390 Urinary tract infection, site not specified: Secondary | ICD-10-CM | POA: Diagnosis not present

## 2019-01-06 DIAGNOSIS — M810 Age-related osteoporosis without current pathological fracture: Secondary | ICD-10-CM | POA: Diagnosis not present

## 2019-01-07 DIAGNOSIS — J441 Chronic obstructive pulmonary disease with (acute) exacerbation: Secondary | ICD-10-CM | POA: Diagnosis not present

## 2019-01-07 DIAGNOSIS — G8929 Other chronic pain: Secondary | ICD-10-CM | POA: Diagnosis not present

## 2019-01-07 DIAGNOSIS — M546 Pain in thoracic spine: Secondary | ICD-10-CM | POA: Diagnosis not present

## 2019-01-07 DIAGNOSIS — M810 Age-related osteoporosis without current pathological fracture: Secondary | ICD-10-CM | POA: Diagnosis not present

## 2019-01-07 DIAGNOSIS — N390 Urinary tract infection, site not specified: Secondary | ICD-10-CM | POA: Diagnosis not present

## 2019-01-07 DIAGNOSIS — I509 Heart failure, unspecified: Secondary | ICD-10-CM | POA: Diagnosis not present

## 2019-01-11 DIAGNOSIS — G8929 Other chronic pain: Secondary | ICD-10-CM | POA: Diagnosis not present

## 2019-01-11 DIAGNOSIS — I509 Heart failure, unspecified: Secondary | ICD-10-CM | POA: Diagnosis not present

## 2019-01-11 DIAGNOSIS — M546 Pain in thoracic spine: Secondary | ICD-10-CM | POA: Diagnosis not present

## 2019-01-11 DIAGNOSIS — N390 Urinary tract infection, site not specified: Secondary | ICD-10-CM | POA: Diagnosis not present

## 2019-01-11 DIAGNOSIS — J441 Chronic obstructive pulmonary disease with (acute) exacerbation: Secondary | ICD-10-CM | POA: Diagnosis not present

## 2019-01-11 DIAGNOSIS — M810 Age-related osteoporosis without current pathological fracture: Secondary | ICD-10-CM | POA: Diagnosis not present

## 2019-01-13 DIAGNOSIS — M546 Pain in thoracic spine: Secondary | ICD-10-CM | POA: Diagnosis not present

## 2019-01-13 DIAGNOSIS — J441 Chronic obstructive pulmonary disease with (acute) exacerbation: Secondary | ICD-10-CM | POA: Diagnosis not present

## 2019-01-13 DIAGNOSIS — I509 Heart failure, unspecified: Secondary | ICD-10-CM | POA: Diagnosis not present

## 2019-01-13 DIAGNOSIS — M810 Age-related osteoporosis without current pathological fracture: Secondary | ICD-10-CM | POA: Diagnosis not present

## 2019-01-13 DIAGNOSIS — N390 Urinary tract infection, site not specified: Secondary | ICD-10-CM | POA: Diagnosis not present

## 2019-01-13 DIAGNOSIS — G8929 Other chronic pain: Secondary | ICD-10-CM | POA: Diagnosis not present

## 2019-01-14 DIAGNOSIS — N390 Urinary tract infection, site not specified: Secondary | ICD-10-CM | POA: Diagnosis not present

## 2019-01-14 DIAGNOSIS — J441 Chronic obstructive pulmonary disease with (acute) exacerbation: Secondary | ICD-10-CM | POA: Diagnosis not present

## 2019-01-14 DIAGNOSIS — M810 Age-related osteoporosis without current pathological fracture: Secondary | ICD-10-CM | POA: Diagnosis not present

## 2019-01-14 DIAGNOSIS — M546 Pain in thoracic spine: Secondary | ICD-10-CM | POA: Diagnosis not present

## 2019-01-14 DIAGNOSIS — I509 Heart failure, unspecified: Secondary | ICD-10-CM | POA: Diagnosis not present

## 2019-01-14 DIAGNOSIS — G8929 Other chronic pain: Secondary | ICD-10-CM | POA: Diagnosis not present

## 2019-01-18 DIAGNOSIS — J441 Chronic obstructive pulmonary disease with (acute) exacerbation: Secondary | ICD-10-CM | POA: Diagnosis not present

## 2019-01-18 DIAGNOSIS — M546 Pain in thoracic spine: Secondary | ICD-10-CM | POA: Diagnosis not present

## 2019-01-18 DIAGNOSIS — N390 Urinary tract infection, site not specified: Secondary | ICD-10-CM | POA: Diagnosis not present

## 2019-01-18 DIAGNOSIS — M810 Age-related osteoporosis without current pathological fracture: Secondary | ICD-10-CM | POA: Diagnosis not present

## 2019-01-18 DIAGNOSIS — I509 Heart failure, unspecified: Secondary | ICD-10-CM | POA: Diagnosis not present

## 2019-01-18 DIAGNOSIS — G8929 Other chronic pain: Secondary | ICD-10-CM | POA: Diagnosis not present

## 2019-01-20 DIAGNOSIS — I509 Heart failure, unspecified: Secondary | ICD-10-CM | POA: Diagnosis not present

## 2019-01-20 DIAGNOSIS — N390 Urinary tract infection, site not specified: Secondary | ICD-10-CM | POA: Diagnosis not present

## 2019-01-20 DIAGNOSIS — M810 Age-related osteoporosis without current pathological fracture: Secondary | ICD-10-CM | POA: Diagnosis not present

## 2019-01-20 DIAGNOSIS — G8929 Other chronic pain: Secondary | ICD-10-CM | POA: Diagnosis not present

## 2019-01-20 DIAGNOSIS — J441 Chronic obstructive pulmonary disease with (acute) exacerbation: Secondary | ICD-10-CM | POA: Diagnosis not present

## 2019-01-20 DIAGNOSIS — M546 Pain in thoracic spine: Secondary | ICD-10-CM | POA: Diagnosis not present

## 2019-01-25 DIAGNOSIS — G8929 Other chronic pain: Secondary | ICD-10-CM | POA: Diagnosis not present

## 2019-01-25 DIAGNOSIS — N390 Urinary tract infection, site not specified: Secondary | ICD-10-CM | POA: Diagnosis not present

## 2019-01-25 DIAGNOSIS — J441 Chronic obstructive pulmonary disease with (acute) exacerbation: Secondary | ICD-10-CM | POA: Diagnosis not present

## 2019-01-25 DIAGNOSIS — M546 Pain in thoracic spine: Secondary | ICD-10-CM | POA: Diagnosis not present

## 2019-01-25 DIAGNOSIS — I509 Heart failure, unspecified: Secondary | ICD-10-CM | POA: Diagnosis not present

## 2019-01-25 DIAGNOSIS — M810 Age-related osteoporosis without current pathological fracture: Secondary | ICD-10-CM | POA: Diagnosis not present

## 2019-01-26 ENCOUNTER — Encounter: Payer: Self-pay | Admitting: Cardiology

## 2019-01-26 ENCOUNTER — Ambulatory Visit (INDEPENDENT_AMBULATORY_CARE_PROVIDER_SITE_OTHER): Payer: Medicare Other | Admitting: Cardiology

## 2019-01-26 VITALS — BP 144/80 | HR 71 | Ht 65.0 in | Wt 121.0 lb

## 2019-01-26 DIAGNOSIS — I2510 Atherosclerotic heart disease of native coronary artery without angina pectoris: Secondary | ICD-10-CM

## 2019-01-26 DIAGNOSIS — J449 Chronic obstructive pulmonary disease, unspecified: Secondary | ICD-10-CM

## 2019-01-26 DIAGNOSIS — I050 Rheumatic mitral stenosis: Secondary | ICD-10-CM | POA: Diagnosis not present

## 2019-01-26 DIAGNOSIS — I4819 Other persistent atrial fibrillation: Secondary | ICD-10-CM

## 2019-01-26 NOTE — Progress Notes (Signed)
Cardiology Office Note  Date: 01/26/2019   ID: Carmen Vaughn, Carmen Vaughn 1920/05/17, MRN [id number]  PCP: Rory Percy, MD  Primary Cardiologist: Rozann Lesches, MD   Chief Complaint  Patient presents with  . Cardiac follow-up    History of Present Illness: Carmen Vaughn is a 85 y.o. female last seen in January of this year by Ms. Bonnell Public PA-C.  She is here today with her son for a follow-up visit.  Still lives at home with him, he assists with her medications.  She is in a wheelchair today, functionally limited with intermittent supplemental oxygen use.  She complains of chronic arthritic pain mainly in her back.  We are managing her cardiac status conservatively.  At this point she is using Lasix on an as-needed basis, usually 3 or 4 times a week per son.  Her weight has been relatively stable overall, fluctuating up or down by a few pounds.  She did not get a follow-up echocardiogram, and I am not pushing this since we do not plan invasive evaluation and she is a poor candidate for valve surgery.  Past Medical History:  Diagnosis Date  . Aortic stenosis    Mild, 06/2012  . Arthritis   . COPD (chronic obstructive pulmonary disease) ([place])   . Coronary atherosclerosis of native coronary artery    Multivessel status post CABG  . Essential hypertension   . GERD (gastroesophageal reflux disease)   . History of kidney stones   . Hypothyroidism   . Mitral regurgitation    Moderate, 06/2012  . Mitral stenosis   . Mixed hyperlipidemia    Statin intolerant    Past Surgical History:  Procedure Laterality Date  . ABDOMINAL HYSTERECTOMY    . CORONARY ARTERY BYPASS GRAFT  2002   LIMA to LAD, SVG to diagonal  . KYPHOPLASTY     two times  . STERNAL WIRES REMOVAL N/A 03/13/2018   Procedure: STERNAL WIRES REMOVAL;  Surgeon: Ivin Poot, MD;  Location: [facility] OR;  Service: Thoracic;  Laterality: N/A;    Current Outpatient Medications  Medication Sig Dispense Refill  . albuterol  (PROVENTIL) (2.5 MG/3ML) 0.083% nebulizer solution Take 2.5 mg by nebulization 4 (four) times daily.     . Cholecalciferol (VITAMIN D3) 1000 units CAPS Take 1 capsule by mouth daily.    . diclofenac sodium (VOLTAREN) 1 % GEL Apply 1 application topically 4 (four) times daily as needed (PAIN).     . feeding supplement (BOOST / RESOURCE BREEZE) LIQD Take 1 Container by mouth 2 (two) times daily as needed.     . furosemide (LASIX) 20 MG tablet TAKE 1 TABLET BY MOUTH AS NEEDED FOR 2 LB WEIGHT GAIN IN 24 HOURS. 90 tablet 0  . guaiFENesin (MUCINEX) 600 MG 12 hr tablet Take 600 mg by mouth 2 (two) times daily.    Marland Kitchen HYDROcodone-acetaminophen (NORCO) 10-325 MG tablet Take 1 tablet by mouth 3 (three) times daily as needed for severe pain.     Marland Kitchen levothyroxine (SYNTHROID, LEVOTHROID) 100 MCG tablet Take 100 mcg by mouth daily before breakfast.     . PROAIR HFA 108 (90 BASE) MCG/ACT inhaler Inhale 2 puffs into the lungs 3 (three) times daily.     . raloxifene (EVISTA) 60 MG tablet Take 60 mg by mouth daily.       No current facility-administered medications for this visit.    Allergies:  Nitroglycerin; Penicillins; Statins; and Aleve [naproxen]   Social History: The patient  reports  that she is a non-smoker but has been exposed to tobacco smoke. She has never used smokeless tobacco. She reports that she does not drink alcohol or use drugs.   ROS:  Please see the history of present illness. Otherwise, complete review of systems is positive for hearing loss.  All other systems are reviewed and negative.   Physical Exam: VS:  BP (!) 144/80 (BP Location: Left Arm)   Pulse 71   Ht 5\' 5"  (1.651 m)   Wt 121 lb (54.9 kg)   SpO2 93%   BMI 20.14 kg/m , BMI Body mass index is 20.14 kg/m.  Wt Readings from Last 3 Encounters:  01/26/19 121 lb (54.9 kg)  12/07/18 120 lb (54.4 kg)  03/13/18 117 lb (53.1 kg)    General: Frail early woman in wheelchair. HEENT: Conjunctiva and lids normal, oropharynx  clear. Neck: Supple, no elevated JVP or carotid bruits, no thyromegaly. Lungs: Clear to auscultation, nonlabored breathing at rest. Cardiac: Irregularly irregular, no S3, 2/6systolic murmur and soft diastolic murmur, no pericardial rub. Abdomen: Soft, nontender, bowel sounds present. Extremities: 1-2+ leg edema. Skin: Warm and dry. Musculoskeletal: Kyphosis noted. Neuropsychiatric: Alert and oriented x3, affect grossly appropriate.  ECG: I personally reviewed the tracing from 11/23/2018 which showed atrial fibrillation.  Recent Labwork: 03/12/2018: ALT 18; AST 23; BUN 23; Creatinine, Ser 0.93; Hemoglobin 11.6; Platelets 132; Potassium 4.3; Sodium 139  January 2020: BUN 26, creatinine 0.96, potassium 4.7, hemoglobin 10.9, platelets 109  Other Studies Reviewed Today:  Echocardiogram 06/04/2017 [facility]): Mild LVH with LVEF 66-59%, grade 3 diastolic dysfunction, mild left atrial enlargement, normal right ventricular contraction, mildly dilated right atrium with secundum ASD and left to right shunt, sclerotic aortic valve with mild aortic regurgitation but no stenosis, moderately thickened mitral valve with moderate to severe mitral stenosis, mild to moderate tricuspid regurgitation with RVSP 30 mmHg.  Assessment and Plan:  1.  Valvular heart disease including moderate to severe mitral stenosis based on investigation at [facility] in 2018.  This is being managed conservatively, she is a poor candidate for surgery.  She does have diastolic heart failure in association with this but is tolerating diuretics with relatively stable weight.  2.  Paroxysmal to persistent atrial fibrillation.  Overall poor candidate for anticoagulation.  Heart rate not rapid at baseline, she has not required AV nodal blockers.  3.  COPD with chronic hypoxic respiratory failure.  She uses home oxygen intermittently.  Continues to follow with Dr. Nadara Mustard.  4.  Multivessel CAD status  post CABG.  LVEF 55 to 60% by last assessment.  No active angina symptoms.  Current medicines were reviewed with the patient today.  Disposition: Follow-up in 3 months.  Signed, Satira Sark, MD, [facility] 01/26/2019 2:36 PM    [facility] [place], [place], [place] [address] Phone: [phone]; Fax: [phone]

## 2019-01-26 NOTE — Patient Instructions (Addendum)
Medication Instructions:   Your physician recommends that you continue on your current medications as directed. Please refer to the Current Medication list given to you today.  Labwork:  NONE  Testing/Procedures:  NONE  Follow-Up:  Your physician recommends that you schedule a follow-up appointment in: 3 months.  Any Other Special Instructions Will Be Listed Below (If Applicable).  If you need a refill on your cardiac medications before your next appointment, please call your pharmacy. 

## 2019-02-01 DIAGNOSIS — E039 Hypothyroidism, unspecified: Secondary | ICD-10-CM | POA: Diagnosis not present

## 2019-02-01 DIAGNOSIS — K219 Gastro-esophageal reflux disease without esophagitis: Secondary | ICD-10-CM | POA: Diagnosis not present

## 2019-02-01 DIAGNOSIS — Z880 Allergy status to penicillin: Secondary | ICD-10-CM | POA: Diagnosis not present

## 2019-02-01 DIAGNOSIS — Z79899 Other long term (current) drug therapy: Secondary | ICD-10-CM | POA: Diagnosis not present

## 2019-02-01 DIAGNOSIS — R319 Hematuria, unspecified: Secondary | ICD-10-CM | POA: Diagnosis not present

## 2019-02-01 DIAGNOSIS — N200 Calculus of kidney: Secondary | ICD-10-CM | POA: Diagnosis not present

## 2019-02-01 DIAGNOSIS — E7800 Pure hypercholesterolemia, unspecified: Secondary | ICD-10-CM | POA: Diagnosis not present

## 2019-02-01 DIAGNOSIS — J449 Chronic obstructive pulmonary disease, unspecified: Secondary | ICD-10-CM | POA: Diagnosis not present

## 2019-02-01 DIAGNOSIS — R1030 Lower abdominal pain, unspecified: Secondary | ICD-10-CM | POA: Diagnosis not present

## 2019-02-01 DIAGNOSIS — I509 Heart failure, unspecified: Secondary | ICD-10-CM | POA: Diagnosis not present

## 2019-02-01 DIAGNOSIS — N390 Urinary tract infection, site not specified: Secondary | ICD-10-CM | POA: Diagnosis not present

## 2019-02-26 DIAGNOSIS — Z515 Encounter for palliative care: Secondary | ICD-10-CM | POA: Diagnosis not present

## 2019-02-26 DIAGNOSIS — R32 Unspecified urinary incontinence: Secondary | ICD-10-CM | POA: Diagnosis not present

## 2019-02-26 DIAGNOSIS — I509 Heart failure, unspecified: Secondary | ICD-10-CM | POA: Diagnosis not present

## 2019-02-26 DIAGNOSIS — J441 Chronic obstructive pulmonary disease with (acute) exacerbation: Secondary | ICD-10-CM | POA: Diagnosis not present

## 2019-02-26 DIAGNOSIS — M810 Age-related osteoporosis without current pathological fracture: Secondary | ICD-10-CM | POA: Diagnosis not present

## 2019-02-26 DIAGNOSIS — F0390 Unspecified dementia without behavioral disturbance: Secondary | ICD-10-CM | POA: Diagnosis not present

## 2019-02-26 DIAGNOSIS — I10 Essential (primary) hypertension: Secondary | ICD-10-CM | POA: Diagnosis not present

## 2019-02-26 DIAGNOSIS — Z9181 History of falling: Secondary | ICD-10-CM | POA: Diagnosis not present

## 2019-02-27 DIAGNOSIS — I509 Heart failure, unspecified: Secondary | ICD-10-CM | POA: Diagnosis not present

## 2019-02-27 DIAGNOSIS — Z9181 History of falling: Secondary | ICD-10-CM | POA: Diagnosis not present

## 2019-02-27 DIAGNOSIS — I10 Essential (primary) hypertension: Secondary | ICD-10-CM | POA: Diagnosis not present

## 2019-02-27 DIAGNOSIS — R32 Unspecified urinary incontinence: Secondary | ICD-10-CM | POA: Diagnosis not present

## 2019-02-27 DIAGNOSIS — J441 Chronic obstructive pulmonary disease with (acute) exacerbation: Secondary | ICD-10-CM | POA: Diagnosis not present

## 2019-02-27 DIAGNOSIS — F0390 Unspecified dementia without behavioral disturbance: Secondary | ICD-10-CM | POA: Diagnosis not present

## 2019-03-01 DIAGNOSIS — J441 Chronic obstructive pulmonary disease with (acute) exacerbation: Secondary | ICD-10-CM | POA: Diagnosis not present

## 2019-03-01 DIAGNOSIS — R32 Unspecified urinary incontinence: Secondary | ICD-10-CM | POA: Diagnosis not present

## 2019-03-01 DIAGNOSIS — F0390 Unspecified dementia without behavioral disturbance: Secondary | ICD-10-CM | POA: Diagnosis not present

## 2019-03-01 DIAGNOSIS — I10 Essential (primary) hypertension: Secondary | ICD-10-CM | POA: Diagnosis not present

## 2019-03-01 DIAGNOSIS — Z9181 History of falling: Secondary | ICD-10-CM | POA: Diagnosis not present

## 2019-03-01 DIAGNOSIS — I509 Heart failure, unspecified: Secondary | ICD-10-CM | POA: Diagnosis not present

## 2019-03-02 DIAGNOSIS — M810 Age-related osteoporosis without current pathological fracture: Secondary | ICD-10-CM | POA: Diagnosis not present

## 2019-03-02 DIAGNOSIS — Z9181 History of falling: Secondary | ICD-10-CM | POA: Diagnosis not present

## 2019-03-02 DIAGNOSIS — I10 Essential (primary) hypertension: Secondary | ICD-10-CM | POA: Diagnosis not present

## 2019-03-02 DIAGNOSIS — R32 Unspecified urinary incontinence: Secondary | ICD-10-CM | POA: Diagnosis not present

## 2019-03-02 DIAGNOSIS — I509 Heart failure, unspecified: Secondary | ICD-10-CM | POA: Diagnosis not present

## 2019-03-02 DIAGNOSIS — M4850XA Collapsed vertebra, not elsewhere classified, site unspecified, initial encounter for fracture: Secondary | ICD-10-CM | POA: Diagnosis not present

## 2019-03-02 DIAGNOSIS — J441 Chronic obstructive pulmonary disease with (acute) exacerbation: Secondary | ICD-10-CM | POA: Diagnosis not present

## 2019-03-02 DIAGNOSIS — Z6822 Body mass index (BMI) 22.0-22.9, adult: Secondary | ICD-10-CM | POA: Diagnosis not present

## 2019-03-02 DIAGNOSIS — J449 Chronic obstructive pulmonary disease, unspecified: Secondary | ICD-10-CM | POA: Diagnosis not present

## 2019-03-02 DIAGNOSIS — F0390 Unspecified dementia without behavioral disturbance: Secondary | ICD-10-CM | POA: Diagnosis not present

## 2019-03-02 DIAGNOSIS — E039 Hypothyroidism, unspecified: Secondary | ICD-10-CM | POA: Diagnosis not present

## 2019-03-02 DIAGNOSIS — M129 Arthropathy, unspecified: Secondary | ICD-10-CM | POA: Diagnosis not present

## 2019-03-03 DIAGNOSIS — R32 Unspecified urinary incontinence: Secondary | ICD-10-CM | POA: Diagnosis not present

## 2019-03-03 DIAGNOSIS — J441 Chronic obstructive pulmonary disease with (acute) exacerbation: Secondary | ICD-10-CM | POA: Diagnosis not present

## 2019-03-03 DIAGNOSIS — F0390 Unspecified dementia without behavioral disturbance: Secondary | ICD-10-CM | POA: Diagnosis not present

## 2019-03-03 DIAGNOSIS — Z9181 History of falling: Secondary | ICD-10-CM | POA: Diagnosis not present

## 2019-03-03 DIAGNOSIS — I10 Essential (primary) hypertension: Secondary | ICD-10-CM | POA: Diagnosis not present

## 2019-03-03 DIAGNOSIS — I509 Heart failure, unspecified: Secondary | ICD-10-CM | POA: Diagnosis not present

## 2019-03-04 DIAGNOSIS — R630 Anorexia: Secondary | ICD-10-CM | POA: Diagnosis not present

## 2019-03-04 DIAGNOSIS — M810 Age-related osteoporosis without current pathological fracture: Secondary | ICD-10-CM | POA: Diagnosis not present

## 2019-03-04 DIAGNOSIS — R531 Weakness: Secondary | ICD-10-CM | POA: Diagnosis not present

## 2019-03-04 DIAGNOSIS — J449 Chronic obstructive pulmonary disease, unspecified: Secondary | ICD-10-CM | POA: Diagnosis not present

## 2019-03-04 DIAGNOSIS — R52 Pain, unspecified: Secondary | ICD-10-CM | POA: Diagnosis not present

## 2019-03-04 DIAGNOSIS — M129 Arthropathy, unspecified: Secondary | ICD-10-CM | POA: Diagnosis not present

## 2019-03-04 DIAGNOSIS — E039 Hypothyroidism, unspecified: Secondary | ICD-10-CM | POA: Diagnosis not present

## 2019-03-05 DIAGNOSIS — M129 Arthropathy, unspecified: Secondary | ICD-10-CM | POA: Diagnosis not present

## 2019-03-05 DIAGNOSIS — E039 Hypothyroidism, unspecified: Secondary | ICD-10-CM | POA: Diagnosis not present

## 2019-03-05 DIAGNOSIS — R531 Weakness: Secondary | ICD-10-CM | POA: Diagnosis not present

## 2019-03-05 DIAGNOSIS — M810 Age-related osteoporosis without current pathological fracture: Secondary | ICD-10-CM | POA: Diagnosis not present

## 2019-03-05 DIAGNOSIS — R630 Anorexia: Secondary | ICD-10-CM | POA: Diagnosis not present

## 2019-03-05 DIAGNOSIS — J449 Chronic obstructive pulmonary disease, unspecified: Secondary | ICD-10-CM | POA: Diagnosis not present

## 2019-03-06 DIAGNOSIS — M129 Arthropathy, unspecified: Secondary | ICD-10-CM | POA: Diagnosis not present

## 2019-03-06 DIAGNOSIS — R531 Weakness: Secondary | ICD-10-CM | POA: Diagnosis not present

## 2019-03-06 DIAGNOSIS — E039 Hypothyroidism, unspecified: Secondary | ICD-10-CM | POA: Diagnosis not present

## 2019-03-06 DIAGNOSIS — M810 Age-related osteoporosis without current pathological fracture: Secondary | ICD-10-CM | POA: Diagnosis not present

## 2019-03-06 DIAGNOSIS — J449 Chronic obstructive pulmonary disease, unspecified: Secondary | ICD-10-CM | POA: Diagnosis not present

## 2019-03-06 DIAGNOSIS — R630 Anorexia: Secondary | ICD-10-CM | POA: Diagnosis not present

## 2019-03-07 DIAGNOSIS — E039 Hypothyroidism, unspecified: Secondary | ICD-10-CM | POA: Diagnosis not present

## 2019-03-07 DIAGNOSIS — J449 Chronic obstructive pulmonary disease, unspecified: Secondary | ICD-10-CM | POA: Diagnosis not present

## 2019-03-07 DIAGNOSIS — M810 Age-related osteoporosis without current pathological fracture: Secondary | ICD-10-CM | POA: Diagnosis not present

## 2019-03-07 DIAGNOSIS — R531 Weakness: Secondary | ICD-10-CM | POA: Diagnosis not present

## 2019-03-07 DIAGNOSIS — M129 Arthropathy, unspecified: Secondary | ICD-10-CM | POA: Diagnosis not present

## 2019-03-07 DIAGNOSIS — R630 Anorexia: Secondary | ICD-10-CM | POA: Diagnosis not present

## 2019-03-08 DIAGNOSIS — R531 Weakness: Secondary | ICD-10-CM | POA: Diagnosis not present

## 2019-03-08 DIAGNOSIS — J449 Chronic obstructive pulmonary disease, unspecified: Secondary | ICD-10-CM | POA: Diagnosis not present

## 2019-03-08 DIAGNOSIS — M129 Arthropathy, unspecified: Secondary | ICD-10-CM | POA: Diagnosis not present

## 2019-03-08 DIAGNOSIS — E039 Hypothyroidism, unspecified: Secondary | ICD-10-CM | POA: Diagnosis not present

## 2019-03-08 DIAGNOSIS — R630 Anorexia: Secondary | ICD-10-CM | POA: Diagnosis not present

## 2019-03-08 DIAGNOSIS — M810 Age-related osteoporosis without current pathological fracture: Secondary | ICD-10-CM | POA: Diagnosis not present

## 2019-03-09 DIAGNOSIS — E039 Hypothyroidism, unspecified: Secondary | ICD-10-CM | POA: Diagnosis not present

## 2019-03-09 DIAGNOSIS — R630 Anorexia: Secondary | ICD-10-CM | POA: Diagnosis not present

## 2019-03-09 DIAGNOSIS — M810 Age-related osteoporosis without current pathological fracture: Secondary | ICD-10-CM | POA: Diagnosis not present

## 2019-03-09 DIAGNOSIS — J449 Chronic obstructive pulmonary disease, unspecified: Secondary | ICD-10-CM | POA: Diagnosis not present

## 2019-03-09 DIAGNOSIS — M129 Arthropathy, unspecified: Secondary | ICD-10-CM | POA: Diagnosis not present

## 2019-03-09 DIAGNOSIS — R531 Weakness: Secondary | ICD-10-CM | POA: Diagnosis not present

## 2019-03-26 DEATH — deceased

## 2019-04-28 ENCOUNTER — Ambulatory Visit: Payer: Medicare Other | Admitting: Cardiology
